# Patient Record
Sex: Male | Born: 1990 | Race: Black or African American | Hispanic: Yes | Marital: Single | Smoking: Current some day smoker
Health system: Southern US, Community
[De-identification: ages and names within clinical notes are randomized; demographics above are authoritative.]

## PROBLEM LIST (undated history)

## (undated) DIAGNOSIS — F32A Depression, unspecified: Secondary | ICD-10-CM

## (undated) DIAGNOSIS — F319 Bipolar disorder, unspecified: Secondary | ICD-10-CM

## (undated) DIAGNOSIS — J45909 Unspecified asthma, uncomplicated: Secondary | ICD-10-CM

## (undated) DIAGNOSIS — F329 Major depressive disorder, single episode, unspecified: Secondary | ICD-10-CM

## (undated) DIAGNOSIS — F909 Attention-deficit hyperactivity disorder, unspecified type: Secondary | ICD-10-CM

---
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
## 2010-12-22 ENCOUNTER — Emergency Department (HOSPITAL_COMMUNITY)
Admission: EM | Admit: 2010-12-22 | Discharge: 2010-12-22 | Disposition: A | Discharge status: Home / Self Care | Payer: BC Managed Care – PPO | Attending: Emergency Medicine | Admitting: Emergency Medicine

## 2010-12-22 DIAGNOSIS — J45901 Unspecified asthma with (acute) exacerbation: Secondary | ICD-10-CM | POA: Insufficient documentation

## 2011-01-05 ENCOUNTER — Emergency Department (HOSPITAL_COMMUNITY): Payer: BC Managed Care – PPO

## 2011-01-05 ENCOUNTER — Emergency Department (HOSPITAL_COMMUNITY)
Admission: EM | Admit: 2011-01-05 | Discharge: 2011-01-05 | Disposition: A | Discharge status: Home / Self Care | Payer: BC Managed Care – PPO | Attending: Emergency Medicine | Admitting: Emergency Medicine

## 2011-01-05 DIAGNOSIS — R0989 Other specified symptoms and signs involving the circulatory and respiratory systems: Secondary | ICD-10-CM | POA: Insufficient documentation

## 2011-01-05 DIAGNOSIS — R05 Cough: Secondary | ICD-10-CM | POA: Insufficient documentation

## 2011-01-05 DIAGNOSIS — R059 Cough, unspecified: Secondary | ICD-10-CM | POA: Insufficient documentation

## 2011-01-05 DIAGNOSIS — R0609 Other forms of dyspnea: Secondary | ICD-10-CM | POA: Insufficient documentation

## 2011-01-05 DIAGNOSIS — J45901 Unspecified asthma with (acute) exacerbation: Secondary | ICD-10-CM | POA: Insufficient documentation

## 2011-01-05 DIAGNOSIS — R0602 Shortness of breath: Secondary | ICD-10-CM | POA: Insufficient documentation

## 2015-06-03 ENCOUNTER — Encounter (HOSPITAL_COMMUNITY): Payer: Self-pay

## 2015-06-03 ENCOUNTER — Emergency Department (HOSPITAL_COMMUNITY)
Admission: EM | Admit: 2015-06-03 | Discharge: 2015-06-03 | Disposition: A | Discharge status: Home / Self Care | Payer: BLUE CROSS/BLUE SHIELD | Attending: Emergency Medicine | Admitting: Emergency Medicine

## 2015-06-03 ENCOUNTER — Emergency Department (HOSPITAL_COMMUNITY): Payer: BLUE CROSS/BLUE SHIELD

## 2015-06-03 DIAGNOSIS — J45901 Unspecified asthma with (acute) exacerbation: Secondary | ICD-10-CM | POA: Diagnosis not present

## 2015-06-03 DIAGNOSIS — R059 Cough, unspecified: Secondary | ICD-10-CM

## 2015-06-03 DIAGNOSIS — R05 Cough: Secondary | ICD-10-CM | POA: Diagnosis present

## 2015-06-03 HISTORY — DX: Unspecified asthma, uncomplicated: J45.909

## 2015-06-03 MED ORDER — IPRATROPIUM-ALBUTEROL 0.5-2.5 (3) MG/3ML IN SOLN
3.0000 mL | Freq: Once | RESPIRATORY_TRACT | Status: AC
  Administered 2015-06-03: 3 mL via RESPIRATORY_TRACT
  Filled 2015-06-03: qty 3

## 2015-06-03 MED ORDER — PREDNISONE 20 MG PO TABS: 40.0000 mg | ORAL_TABLET | Freq: Every day | ORAL | Status: DC

## 2015-06-03 MED ORDER — DEXAMETHASONE SODIUM PHOSPHATE 10 MG/ML IJ SOLN
10.0000 mg | Freq: Once | INTRAMUSCULAR | Status: AC
  Administered 2015-06-03: 10 mg via INTRAMUSCULAR
  Filled 2015-06-03: qty 1

## 2015-06-03 MED ORDER — ALBUTEROL SULFATE HFA 108 (90 BASE) MCG/ACT IN AERS: 1.0000 | INHALATION_SPRAY | Freq: Four times a day (QID) | RESPIRATORY_TRACT | Status: DC | PRN

## 2015-06-03 MED ORDER — ALBUTEROL SULFATE (2.5 MG/3ML) 0.083% IN NEBU
INHALATION_SOLUTION | RESPIRATORY_TRACT | Status: AC
  Filled 2015-06-03: qty 6

## 2015-06-03 MED ORDER — ALBUTEROL SULFATE (2.5 MG/3ML) 0.083% IN NEBU
5.0000 mg | INHALATION_SOLUTION | Freq: Once | RESPIRATORY_TRACT | Status: AC
  Administered 2015-06-03: 5 mg via RESPIRATORY_TRACT

## 2015-06-03 MED ORDER — BENZONATATE 100 MG PO CAPS: 100.0000 mg | ORAL_CAPSULE | Freq: Three times a day (TID) | ORAL | Status: DC

## 2015-06-03 NOTE — ED Notes (Signed)
Pt reports onset yesterday cough, wheezing, fever.  Ran out of asthma inhaler.  Talking in complete sentences, NAD.

## 2015-06-03 NOTE — ED Provider Notes (Signed)
CSN: [id number]     Arrival date & time 06/03/15  1328 History  By signing my name below, I, Soijett Blue, attest that this documentation has been prepared under the direction and in the presence of Jovani Colquhoun Y. Lamon Rotundo, PA-C Electronically Signed: Soijett Blue, ED Scribe. 06/03/2015. 2:10 PM.   Chief Complaint  Patient presents with  . Cough      The history is provided by the patient. No language interpreter was used.    Patrick Clements is a 25 y.o. male with a PMHx of asthma, who presents to the Emergency Department complaining of dry cough onset yesterday. He reports that his symptoms began with a cold that started yesterday. Pt has sick contacts who have similar symptoms. He notes that he ran out of his inhaler that he uses every day and not only for sick episodes. Pt states that his symptoms have been mildly alleviated with the breathing treatment given in the ED waiting room. He states that he is having associated symptoms of wheezing, subjective fever, and SOB. He notes that coughing induces wheezing and SOB, which is similar to prior asthma attacks. He states that he has not tried any medications for the relief for his symptoms. He denies fever, chills, color change, rash, wound, rhinorrhea, nasal congestion, and any other symptoms. Denies having a PCP at this time. Pt notes that he quit smoking cigarettes this morning.    Past Medical History  Diagnosis Date  . Asthma    History reviewed. No pertinent past surgical history. History reviewed. No pertinent family history. Social History  Substance Use Topics  . Smoking status: Passive Smoke Exposure - Never Smoker  . Smokeless tobacco: None  . Alcohol Use: Yes    Review of Systems  Constitutional: Positive for fever (subjective). Negative for chills.  HENT: Negative for congestion and rhinorrhea.   Respiratory: Positive for cough, shortness of breath and wheezing.   Skin: Negative for color change, rash and wound.  All other systems  reviewed and are negative.     Allergies  Review of patient's allergies indicates no known allergies.  Home Medications   Prior to Admission medications   Not on File   BP 129/69 mmHg  Pulse 109  Temp(Src) 98.1 F (36.7 C) (Oral)  Resp 20  Ht  (1.676 m)  Wt 257 lb 8 oz (116.801 kg)  BMI 41.58 kg/m2  SpO2 97% Physical Exam  Constitutional: He is oriented to person, place, and time. He appears well-developed and well-nourished. No distress.  HENT:  Head: Normocephalic and atraumatic.  Eyes: EOM are normal.  Neck: Neck supple.  Cardiovascular: Normal rate, regular rhythm and normal heart sounds.  Exam reveals no gallop and no friction rub.   No murmur heard. Pulmonary/Chest: Effort normal. No respiratory distress. He has wheezes. He has rhonchi.  Diffuse expiratory wheezes in all lung fields. Bilateral lung bases have soft rhonchi that clear with coughing.   Musculoskeletal: Normal range of motion.  Neurological: He is alert and oriented to person, place, and time.  Skin: Skin is warm and dry.  Psychiatric: He has a normal mood and affect. His behavior is normal.  Nursing note and vitals reviewed.   ED Course  Procedures (including critical care time) DIAGNOSTIC STUDIES: Oxygen Saturation is 97% on RA, nl by my interpretation.    COORDINATION OF CARE: 2:07 PM Discussed treatment plan with pt at bedside which includes CXR, breathing treatment, steroid Rx, and pt agreed to plan.    Labs  Review Labs Reviewed - No data to display  Imaging Review Dg Chest 2 View  06/03/2015  CLINICAL DATA:  Cough and shortness of breath EXAM: CHEST  2 VIEW COMPARISON:  January 05, 2011 FINDINGS: The heart size and mediastinal contours are within normal limits. Both lungs are clear. The visualized skeletal structures are unremarkable. IMPRESSION: No active cardiopulmonary disease. Electronically Signed   By: Gerome Samavid  Williams III M.D   On: 06/03/2015 15:10   I have personally  reviewed and evaluated these images as part of my medical decision-making.   EKG Interpretation None      MDM   Final diagnoses:  Cough  Asthma exacerbation    CXR negative. Pt given duoneb and decadron in the ED with much improvement of wheezing. Pt is breathing comfortably with no tachypnea or hypoxia. No increased WOB. Suspect viral URI with concomittant URI exacerbation. Refill for albuterol given. Rx also given for few days of steroids and tessalon. I discussed with pt that he should talk to PCP about PFTs and consider daily inhaled corticosteroid given frequency of albuterol use. Encouraged continuing with smoking cessation. Resource guide given. ER return precautions given.   I personally performed the services described in this documentation, which was scribed in my presence. The recorded information has been reviewed and is accurate.   Carlene CoriaSerena Y Ariel Dimitri, PA-C 06/04/15 1426  Donnetta HutchingBrian Cook, MD 06/04/15 [phone]

## 2015-06-03 NOTE — Discharge Instructions (Signed)
Take medications as prescribed. Return to the emergency room for worsening condition or new concerning symptoms. Follow up with your regular doctor. If you don't have a regular doctor use one of the numbers below to establish a primary care doctor. ° ° °Emergency Department Resource Guide °1) Find a Doctor and Pay Out of Pocket °Although you won't have to find out who is covered by your insurance plan, it is a good idea to ask around and get recommendations. You will then need to call the office and see if the doctor you have chosen will accept you as a new patient and what types of options they offer for patients who are self-pay. Some doctors offer discounts or will set up payment plans for their patients who do not have insurance, but you will need to ask so you aren't surprised when you get to your appointment. ° °2) Contact Your Local Health Department °Not all health departments have doctors that can see patients for sick visits, but many do, so it is worth a call to see if yours does. If you don't know where your local health department is, you can check in your phone book. The CDC also has a tool to help you locate your state's health department, and many state websites also have listings of all of their local health departments. ° °3) Find a Walk-in Clinic °If your illness is not likely to be very severe or complicated, you may want to try a walk in clinic. These are popping up all over the country in pharmacies, drugstores, and shopping centers. They're usually staffed by nurse practitioners or physician assistants that have been trained to treat common illnesses and complaints. They're usually fairly quick and inexpensive. However, if you have serious medical issues or chronic medical problems, these are probably not your best option. ° °No Primary Care Doctor: °- Call [facility] at  832-8000 - they can help you locate a primary care doctor that  accepts your insurance, provides certain services,  etc. °- Physician Referral Service- 1-800-533-3463 ° °Emergency Department Resource Guide °1) Find a Doctor and Pay Out of Pocket °Although you won't have to find out who is covered by your insurance plan, it is a good idea to ask around and get recommendations. You will then need to call the office and see if the doctor you have chosen will accept you as a new patient and what types of options they offer for patients who are self-pay. Some doctors offer discounts or will set up payment plans for their patients who do not have insurance, but you will need to ask so you aren't surprised when you get to your appointment. ° °2) Contact Your Local Health Department °Not all health departments have doctors that can see patients for sick visits, but many do, so it is worth a call to see if yours does. If you don't know where your local health department is, you can check in your phone book. The CDC also has a tool to help you locate your state's health department, and many state websites also have listings of all of their local health departments. ° °3) Find a Walk-in Clinic °If your illness is not likely to be very severe or complicated, you may want to try a walk in clinic. These are popping up all over the country in pharmacies, drugstores, and shopping centers. They're usually staffed by nurse practitioners or physician assistants that have been trained to treat common illnesses and complaints. They're usually fairly   quick and inexpensive. However, if you have serious medical issues or chronic medical problems, these are probably not your best option. ° °No Primary Care Doctor: °- Call [facility] at  832-8000 - they can help you locate a primary care doctor that  accepts your insurance, provides certain services, etc. °- Physician Referral Service- 1-800-533-3463 ° °Chronic Pain Problems: °Organization         Address  Phone   Notes  °[facility]  ([phone]) [phone] Patients need to be referred by  their primary care doctor.  ° °Medication Assistance: °Organization         Address  Phone   Notes  °[place] [facility] [address] °[place], [place] [address] ([phone]) 641-8030 --Must be a resident of [place] °-- Must have NO insurance coverage whatsoever (no Medicaid/ Medicare, etc.) °-- The pt. MUST have a primary care doctor that directs their care regularly and follows them in the community °  °[facility]  (866) 331-1348   °[employer]  (888) 892-1162   ° °Agencies that provide inexpensive medical care: °Organization         Address  Phone   Notes  °[facility]  ([phone]) [phone]   °[facility]    (336) 832-7272   °[facility] [address] °[place], [place] [address] (336) [phone]   °[facility] [address], °[place] (336) [phone]   °[facility]    (336) 373-0678   °[facility]    (336) 272-1050   °[facility] ° [address], [place] Phone:  (336) [phone], Fax:  ([phone]) [phone] Hours of Operation:  9 am - 6 pm, M-F.  Also accepts Medicaid/Medicare and self-pay.  °[facility] ° [address], [place] Phone: (336) [phone], Fax: ([phone]) [phone]. Hours of Operation:  8:30 am - 5:30 pm, M-F.  Also accepts Medicaid and self-pay.  °[facility] [place] [address], [place] Phone: ([phone]) [phone]   °[facility] [address], [place], [place] (336)723-1848, Ext. 123 Mondays & Thursdays: 7-9 AM.  First 15 patients are seen on a first come, first serve basis. °  ° °Medicaid-accepting [place] Providers: ° °Organization         Address  Phone   Notes  °[facility] [address], [place] ([phone]) [phone] Also accepts self-pay patients.  °[facility] [address], [place] ° (336) [phone]   °[facility]  [address], [place] (336) [phone]   °[facility] [address] [place] [address], [place] (336) [phone]   °[facility] [address], [place]  ° ([phone]) [phone] Only accepts [place] Access Medicaid patients after they have their name applied to their card.  ° °Self-Pay (no insurance) in [place]: ° °Organization         Address  Phone   Notes  °Sickle Cell Patients, [facility] [address], [place] (336) [phone]   °[facility] [address], [place] (336) [phone]   °[facility] ° [address], [place] ([phone]) [phone]   °[facility]/Dr. Osei-Bonsu ° [address] [place] [address],  or [address], [place] [phone]) [phone] Phone number   for both [place] and [place] locations is the same.  °[facility] [address], [place] ([phone]) [phone]   °[facility] [address] [place] [address], [place] or [address] ([phone]) [phone] °(336) [phone]   °[facility] [address], [place] (336) [phone], phone; ([phone]) [phone], fax Sees patients 1st and 3rd Saturday of every month.  Must not qualify for public or private insurance (i.e. Medicaid, Medicare, [place] Health Choice, Veterans' Benefits) • Household income should be no more than 200% of the poverty level •The clinic cannot treat you if you are pregnant or think you are pregnant • Sexually transmitted diseases are not treated at the clinic.  ° ° ° °

## 2015-06-03 NOTE — ED Notes (Signed)
PT states " I use a mask for my treatments."

## 2015-09-01 DIAGNOSIS — J40 Bronchitis, not specified as acute or chronic: Secondary | ICD-10-CM | POA: Insufficient documentation

## 2015-09-01 DIAGNOSIS — Z7722 Contact with and (suspected) exposure to environmental tobacco smoke (acute) (chronic): Secondary | ICD-10-CM | POA: Insufficient documentation

## 2015-09-01 DIAGNOSIS — R05 Cough: Secondary | ICD-10-CM | POA: Diagnosis present

## 2015-09-02 ENCOUNTER — Emergency Department (HOSPITAL_COMMUNITY): Payer: BLUE CROSS/BLUE SHIELD

## 2015-09-02 ENCOUNTER — Emergency Department (HOSPITAL_COMMUNITY)
Admission: EM | Admit: 2015-09-02 | Discharge: 2015-09-02 | Disposition: A | Discharge status: Home / Self Care | Payer: BLUE CROSS/BLUE SHIELD | Attending: Emergency Medicine | Admitting: Emergency Medicine

## 2015-09-02 ENCOUNTER — Encounter (HOSPITAL_COMMUNITY): Payer: Self-pay | Admitting: Emergency Medicine

## 2015-09-02 DIAGNOSIS — J40 Bronchitis, not specified as acute or chronic: Secondary | ICD-10-CM

## 2015-09-02 MED ORDER — ALBUTEROL SULFATE HFA 108 (90 BASE) MCG/ACT IN AERS
1.0000 | INHALATION_SPRAY | Freq: Once | RESPIRATORY_TRACT | Status: AC
  Administered 2015-09-02: 1 via RESPIRATORY_TRACT
  Filled 2015-09-02: qty 6.7

## 2015-09-02 MED ORDER — ALBUTEROL SULFATE HFA 108 (90 BASE) MCG/ACT IN AERS: 1.0000 | INHALATION_SPRAY | Freq: Four times a day (QID) | RESPIRATORY_TRACT | Status: DC | PRN

## 2015-09-02 MED ORDER — ALBUTEROL SULFATE (2.5 MG/3ML) 0.083% IN NEBU
INHALATION_SOLUTION | RESPIRATORY_TRACT | Status: AC
  Filled 2015-09-02: qty 6

## 2015-09-02 MED ORDER — PREDNISONE 20 MG PO TABS: 40.0000 mg | ORAL_TABLET | Freq: Every day | ORAL | Status: DC

## 2015-09-02 MED ORDER — ALBUTEROL SULFATE (2.5 MG/3ML) 0.083% IN NEBU
5.0000 mg | INHALATION_SOLUTION | Freq: Once | RESPIRATORY_TRACT | Status: AC
  Administered 2015-09-02: 5 mg via RESPIRATORY_TRACT

## 2015-09-02 NOTE — ED Provider Notes (Signed)
CSN: [id number]     Arrival date & time 09/01/15  2335 History  By signing my name below, I, Phillis HaggisGabriella Gaje, attest that this documentation has been prepared under the direction and in the presence of Derwood KaplanAnkit Maylynn Orzechowski, MD. Electronically Signed: Phillis HaggisGabriella Gaje, ED Scribe. 09/02/2015. 3:47 AM.    Chief Complaint  Patient presents with  . Asthma  . Cough   The history is provided by the patient. No language interpreter was used.  HPI Comments: Patrick Clements is a 25 y.o. male with a hx of asthma who presents to the Emergency Department complaining of persistent dry cough onset 2 days ago. Pt reports associated wheezing and SOB. He reports that this feels like an exacerbation of his asthma which started after running around with his child. Pt did not use any inhalers or breathing treatments at home. Pt states that he does not have any medications at home. He states that he typically comes into the ED about once a month for asthma related symptoms. He reports mild relief with breathing treatment given in ED. He denies fever or chest pain. Pt is an occasional smoker. He does not have a PCP.  Past Medical History  Diagnosis Date  . Asthma    History reviewed. No pertinent past surgical history. History reviewed. No pertinent family history. Social History  Substance Use Topics  . Smoking status: Passive Smoke Exposure - Never Smoker  . Smokeless tobacco: None  . Alcohol Use: Yes    Review of Systems 10 Systems reviewed and all are negative for acute change except as noted in the HPI.  Allergies  Review of patient's allergies indicates no known allergies.  Home Medications   Prior to Admission medications   Medication Sig Start Date End Date Taking? Authorizing Provider  albuterol (PROVENTIL HFA;VENTOLIN HFA) 108 (90 Base) MCG/ACT inhaler Inhale 1-2 puffs into the lungs every 6 (six) hours as needed for wheezing or shortness of breath. 09/02/15   Derwood KaplanAnkit Vivian Neuwirth, MD  benzonatate (TESSALON)  100 MG capsule Take 1 capsule (100 mg total) by mouth every 8 (eight) hours. Patient not taking: Reported on 09/02/2015 06/03/15   Ace GinsSerena Y Sam, PA-C  predniSONE (DELTASONE) 20 MG tablet Take 2 tablets (40 mg total) by mouth daily. 09/02/15   Lotta Frankenfield, MD   BP 122/78 mmHg  Pulse 74  Temp(Src) 98.3 F (36.8 C) (Oral)  Resp 16  SpO2 99% Physical Exam  Constitutional: He is oriented to person, place, and time. He appears well-developed and well-nourished.  HENT:  Head: Normocephalic and atraumatic.  Eyes: EOM are normal. Pupils are equal, round, and reactive to light.  Neck: Normal range of motion. Neck supple.  Cardiovascular: Normal rate, regular rhythm and normal heart sounds.   Pulmonary/Chest: Effort normal and breath sounds normal. He has no wheezes.  Lungs clear to auscultation  Abdominal: Soft. There is no tenderness.  Musculoskeletal: Normal range of motion.  Neurological: He is alert and oriented to person, place, and time.  Skin: Skin is warm and dry.  Psychiatric: He has a normal mood and affect. His behavior is normal.  Nursing note and vitals reviewed.   ED Course  Procedures (including critical care time) DIAGNOSTIC STUDIES: Oxygen Saturation is 98% on RA, normal by my interpretation.    COORDINATION OF CARE: 3:44 AM-Discussed treatment plan which includes antibiotics and chest x-ray with pt at bedside and pt agreed to plan.    Labs Review Labs Reviewed - No data to display  Imaging Review Dg  Chest 2 View  09/02/2015  CLINICAL DATA:  Cough for 2 days.  Shortness of breath.  Nonsmoker. EXAM: CHEST  2 VIEW COMPARISON:  06/03/2015 FINDINGS: Normal heart size and pulmonary vascularity. Suggestion of developing hazy opacity in the right perihilar region possibly representing developing pneumonia. Left lung is clear. No blunting of costophrenic angles. No pneumothorax. IMPRESSION: Suggestion of developing right perihilar infiltration. Electronically Signed   By:  Burman Nieves M.D.   On: 09/02/2015 01:50   I have personally reviewed and evaluated these images and lab results as part of my medical decision-making.   EKG Interpretation None      MDM   Final diagnoses:  Bronchitis    Pt comes in with cc of wheezing. Cough x 2 days, dry, lungs are clear now, but pt was wheezing when he arrived. Nebulizer handed over, and insurance and steroids rx given.    Derwood Kaplan, MD 09/02/15 [phone]

## 2015-09-02 NOTE — ED Notes (Signed)
Patient arrives with complaint of asthma exacerbation. States onset today after running with child. States nagging cough for 2 days. Wheezing and diminished in all fields.

## 2015-09-02 NOTE — Discharge Instructions (Signed)
Asthma Attack Prevention While you may not be able to control the fact that you have asthma, you can take actions to prevent asthma attacks. The best way to prevent asthma attacks is to maintain good control of your asthma. You can achieve this by:  Taking your medicines as directed.  Avoiding things that can irritate your airways or make your asthma symptoms worse (asthma triggers).  Keeping track of how well your asthma is controlled and of any changes in your symptoms.  Responding quickly to worsening asthma symptoms (asthma attack).  Seeking emergency care when it is needed. WHAT ARE SOME WAYS TO PREVENT AN ASTHMA ATTACK? Have a Plan Work with your health care provider to create a written plan for managing and treating your asthma attacks (asthma action plan). This plan includes:  A list of your asthma triggers and how you can avoid them.  Information on when medicines should be taken and when their dosages should be changed.  The use of a device that measures how well your lungs are working (peak flow meter). Monitor Your Asthma Use your peak flow meter and record your results in a journal every day. A drop in your peak flow numbers on one or more days may indicate the start of an asthma attack. This can happen even before you start to feel symptoms. You can prevent an asthma attack from getting worse by following the steps in your asthma action plan. Avoid Asthma Triggers Work with your asthma health care provider to find out what your asthma triggers are. This can be done by:  Allergy testing.  Keeping a journal that notes when asthma attacks occur and the factors that may have contributed to them.  Determining if there are other medical conditions that are making your asthma worse. Once you have determined your asthma triggers, take steps to avoid them. This may include avoiding excessive or prolonged exposure to:  Dust. Have someone dust and vacuum your home for you once or  twice a week. Using a high-efficiency particulate arrestance (HEPA) vacuum is best.  Smoke. This includes campfire smoke, forest fire smoke, and secondhand smoke from tobacco products.  Pet dander. Avoid contact with animals that you know you are allergic to.  Allergens from trees, grasses or pollens. Avoid spending a lot of time outdoors when pollen counts are high, and on very windy days.  Very cold, dry, or humid air.  Mold.  Foods that contain high amounts of sulfites.  Strong odors.  Outdoor air pollutants, such as engine exhaust.  Indoor air pollutants, such as aerosol sprays and fumes from household cleaners.  Household pests, including dust mites and cockroaches, and pest droppings.  Certain medicines, including NSAIDs. Always talk to your health care provider before stopping or starting any new medicines. Medicines Take over-the-counter and prescription medicines only as told by your health care provider. Many asthma attacks can be prevented by carefully following your medicine schedule. Taking your medicines correctly is especially important when you cannot avoid certain asthma triggers. Act Quickly If an asthma attack does happen, acting quickly can decrease how severe it is and how long it lasts. Take these steps:   Pay attention to your symptoms. If you are coughing, wheezing, or having difficulty breathing, do not wait to see if your symptoms go away on their own. Follow your asthma action plan.  If you have followed your asthma action plan and your symptoms are not improving, call your health care provider or seek immediate medical care   at the nearest hospital. It is important to note how often you need to use your fast-acting rescue inhaler. If you are using your rescue inhaler more often, it may mean that your asthma is not under control. Adjusting your asthma treatment plan may help you to prevent future asthma attacks and help you to gain better control of your  condition. HOW CAN I PREVENT AN ASTHMA ATTACK WHEN I EXERCISE? Follow advice from your health care provider about whether you should use your fast-acting inhaler before exercising. Many people with asthma experience exercise-induced bronchoconstriction (EIB). This condition often worsens during vigorous exercise in cold, humid, or dry environments. Usually, people with EIB can stay very active by pre-treating with a fast-acting inhaler before exercising.   This information is not intended to replace advice given to you by your health care provider. Make sure you discuss any questions you have with your health care provider.   Document Released: 02/20/2009 Document Revised: 11/23/2014 Document Reviewed: 08/04/2014 Elsevier Interactive Patient Education 2016 [employer]  

## 2015-09-13 ENCOUNTER — Encounter (HOSPITAL_COMMUNITY): Payer: Self-pay | Admitting: Emergency Medicine

## 2015-09-13 ENCOUNTER — Ambulatory Visit (HOSPITAL_COMMUNITY)
Admission: EM | Admit: 2015-09-13 | Discharge: 2015-09-13 | Disposition: A | Discharge status: Home / Self Care | Payer: BLUE CROSS/BLUE SHIELD | Attending: Family Medicine | Admitting: Family Medicine

## 2015-09-13 DIAGNOSIS — J45901 Unspecified asthma with (acute) exacerbation: Secondary | ICD-10-CM

## 2015-09-13 MED ORDER — PREDNISONE 10 MG (21) PO TBPK: 10.0000 mg | ORAL_TABLET | Freq: Every day | ORAL | Status: DC

## 2015-09-13 MED ORDER — ALBUTEROL SULFATE HFA 108 (90 BASE) MCG/ACT IN AERS: 2.0000 | INHALATION_SPRAY | RESPIRATORY_TRACT | Status: DC | PRN

## 2015-09-13 MED ORDER — ALBUTEROL SULFATE (2.5 MG/3ML) 0.083% IN NEBU: 2.5000 mg | INHALATION_SOLUTION | Freq: Once | RESPIRATORY_TRACT | Status: DC

## 2015-09-13 MED ORDER — ALBUTEROL SULFATE (2.5 MG/3ML) 0.083% IN NEBU
INHALATION_SOLUTION | RESPIRATORY_TRACT | Status: AC
  Filled 2015-09-13: qty 3

## 2015-09-13 NOTE — Discharge Instructions (Signed)
Asthma, Adult Asthma is a condition of the lungs in which the airways tighten and narrow. Asthma can make it hard to breathe. Asthma cannot be cured, but medicine and lifestyle changes can help control it. Asthma may be started (triggered) by:  Animal skin flakes (dander).  Dust.  Cockroaches.  Pollen.  Mold.  Smoke.  Cleaning products.  Hair sprays or aerosol sprays.  Paint fumes or strong smells.  Cold air, weather changes, and winds.  Crying or laughing hard.  Stress.  Certain medicines or drugs.  Foods, such as dried fruit, potato chips, and sparkling grape juice.  Infections or conditions (colds, flu).  Exercise.  Certain medical conditions or diseases.  Exercise or tiring activities. HOME CARE   Take medicine as told by your doctor.  Use a peak flow meter as told by your doctor. A peak flow meter is a tool that measures how well the lungs are working.  Record and keep track of the peak flow meter's readings.  Understand and use the asthma action plan. An asthma action plan is a written plan for taking care of your asthma and treating your attacks.  To help prevent asthma attacks:  Do not smoke. Stay away from secondhand smoke.  Change your heating and air conditioning filter often.  Limit your use of fireplaces and wood stoves.  Get rid of pests (such as roaches and mice) and their droppings.  Throw away plants if you see mold on them.  Clean your floors. Dust regularly. Use cleaning products that do not smell.  Have someone vacuum when you are not home. Use a vacuum cleaner with a HEPA filter if possible.  Replace carpet with wood, tile, or vinyl flooring. Carpet can trap animal skin flakes and dust.  Use allergy-proof pillows, mattress covers, and box spring covers.  Wash bed sheets and blankets every week in hot water and dry them in a dryer.  Use blankets that are made of polyester or cotton.  Clean bathrooms and kitchens with bleach.  If possible, have someone repaint the walls in these rooms with mold-resistant paint. Keep out of the rooms that are being cleaned and painted.  Wash hands often. GET HELP IF:  You have make a whistling sound when breaking (wheeze), have shortness of breath, or have a cough even if taking medicine to prevent attacks.  The colored mucus you cough up (sputum) is thicker than usual.  The colored mucus you cough up changes from clear or white to yellow, green, gray, or bloody.  You have problems from the medicine you are taking such as:  A rash.  Itching.  Swelling.  Trouble breathing.  You need reliever medicines more than 2-3 times a week.  Your peak flow measurement is still at 50-79% of your personal best after following the action plan for 1 hour.  You have a fever. GET HELP RIGHT AWAY IF:   You seem to be worse and are not responding to medicine during an asthma attack.  You are short of breath even at rest.  You get short of breath when doing very little activity.  You have trouble eating, drinking, or talking.  You have chest pain.  You have a fast heartbeat.  Your lips or fingernails start to turn blue.  You are light-headed, dizzy, or faint.  Your peak flow is less than 50% of your personal best.   This information is not intended to replace advice given to you by your health care provider. Make sure   you discuss any questions you have with your health care provider.   Document Released: 08/21/2007 Document Revised: 11/23/2014 Document Reviewed: 10/01/2012 Elsevier Interactive Patient Education 2016 Elsevier Inc.  

## 2015-09-13 NOTE — ED Provider Notes (Signed)
CSN: [id number]     Arrival date & time 09/13/15  1940 History   First MD Initiated Contact with Patient 09/13/15 1950     Chief Complaint  Patient presents with  . Shortness of Breath   (Consider location/radiation/quality/duration/timing/severity/associated sxs/prior Treatment) Patient is a 25 y.o. male presenting with shortness of breath. The history is provided by the patient.  Shortness of Breath Severity:  Moderate Onset quality:  Sudden Duration:  2 days Timing:  Constant Progression:  Waxing and waning Chronicity:  Recurrent Context: activity   Relieved by:  Inhaler Ineffective treatments:  Inhaler   Past Medical History  Diagnosis Date  . Asthma    History reviewed. No pertinent past surgical history. History reviewed. No pertinent family history. Social History  Substance Use Topics  . Smoking status: Passive Smoke Exposure - Never Smoker  . Smokeless tobacco: None  . Alcohol Use: Yes    Review of Systems  Constitutional: Negative.   HENT: Negative.   Eyes: Negative.   Respiratory: Positive for shortness of breath.   Cardiovascular: Negative.   Gastrointestinal: Negative.   Endocrine: Negative.   Genitourinary: Negative.   Musculoskeletal: Negative.   Skin: Negative.   Allergic/Immunologic: Negative.   Neurological: Negative.   Hematological: Negative.   Psychiatric/Behavioral: Negative.     Allergies  Review of patient's allergies indicates no known allergies.  Home Medications   Prior to Admission medications   Medication Sig Start Date End Date Taking? Authorizing Provider  albuterol (PROVENTIL HFA;VENTOLIN HFA) 108 (90 Base) MCG/ACT inhaler Inhale 1-2 puffs into the lungs every 6 (six) hours as needed for wheezing or shortness of breath. 09/02/15  Yes Derwood KaplanAnkit Nanavati, MD  albuterol (PROVENTIL HFA;VENTOLIN HFA) 108 (90 Base) MCG/ACT inhaler Inhale 2 puffs into the lungs every 4 (four) hours as needed for wheezing or shortness of breath. 09/13/15    Deatra CanterWilliam J Oxford, FNP  benzonatate (TESSALON) 100 MG capsule Take 1 capsule (100 mg total) by mouth every 8 (eight) hours. Patient not taking: Reported on 09/02/2015 06/03/15   Ace GinsSerena Y Sam, PA-C  predniSONE (DELTASONE) 20 MG tablet Take 2 tablets (40 mg total) by mouth daily. 09/02/15   Derwood KaplanAnkit Nanavati, MD  predniSONE (STERAPRED UNI-PAK 21 TAB) 10 MG (21) TBPK tablet Take 1 tablet (10 mg total) by mouth daily. Take 6-5-4-3-2-1 po qd 09/13/15   Deatra CanterWilliam J Oxford, FNP   Meds Ordered and Administered this Visit   Medications  albuterol (PROVENTIL) (2.5 MG/3ML) 0.083% nebulizer solution 2.5 mg (not administered)    BP 125/78 mmHg  Pulse 85  Temp(Src) 97.8 F (36.6 C) (Oral)  Resp 18  SpO2 97% No data found.   Physical Exam  Constitutional: He appears well-developed and well-nourished.  HENT:  Head: Normocephalic.  Right Ear: External ear normal.  Left Ear: External ear normal.  Mouth/Throat: Oropharynx is clear and moist.  Eyes: Conjunctivae and EOM are normal. Pupils are equal, round, and reactive to light.  Neck: Normal range of motion. Neck supple.  Cardiovascular: Normal rate, regular rhythm and normal heart sounds.   Pulmonary/Chest: Effort normal.  BBS diminished throughout  Abdominal: Soft.    ED Course  Procedures (including critical care time)  Labs Review Labs Reviewed - No data to display  Imaging Review No results found.   Visual Acuity Review  Right Eye Distance:   Left Eye Distance:   Bilateral Distance:    Right Eye Near:   Left Eye Near:    Bilateral Near:  MDM  1. Asthma exacerbation - Neb tx albuterol 2.5/873ml, Prednisone taper 10mg  6-5-4-3-2-1 po qd #21    Albuterol MDI 2 puffs q 4 hours prn.  Explained need to follow up with PCP.      Deatra CanterWilliam J Oxford, FNP 09/13/15 2020

## 2015-09-13 NOTE — ED Notes (Signed)
[facility] a complaint of shortness of breath and wheezing x 2 days. The patient does have a hx of asthma. The patient stated that he needed a note to return to work.

## 2015-09-15 ENCOUNTER — Ambulatory Visit (HOSPITAL_COMMUNITY)
Admission: EM | Admit: 2015-09-15 | Discharge: 2015-09-15 | Disposition: A | Discharge status: Home / Self Care | Payer: BLUE CROSS/BLUE SHIELD | Attending: Emergency Medicine | Admitting: Emergency Medicine

## 2015-09-15 ENCOUNTER — Encounter (HOSPITAL_COMMUNITY): Payer: Self-pay | Admitting: Nurse Practitioner

## 2015-09-15 DIAGNOSIS — Z9119 Patient's noncompliance with other medical treatment and regimen: Secondary | ICD-10-CM

## 2015-09-15 DIAGNOSIS — J4541 Moderate persistent asthma with (acute) exacerbation: Secondary | ICD-10-CM | POA: Diagnosis not present

## 2015-09-15 DIAGNOSIS — Z91199 Patient's noncompliance with other medical treatment and regimen due to unspecified reason: Secondary | ICD-10-CM

## 2015-09-15 MED ORDER — ALBUTEROL SULFATE (2.5 MG/3ML) 0.083% IN NEBU
2.5000 mg | INHALATION_SOLUTION | Freq: Once | RESPIRATORY_TRACT | Status: AC
  Administered 2015-09-15: 2.5 mg via RESPIRATORY_TRACT

## 2015-09-15 MED ORDER — METHYLPREDNISOLONE ACETATE 80 MG/ML IJ SUSP
80.0000 mg | Freq: Once | INTRAMUSCULAR | Status: AC
  Administered 2015-09-15: 80 mg via INTRAMUSCULAR

## 2015-09-15 MED ORDER — METHYLPREDNISOLONE ACETATE 80 MG/ML IJ SUSP
INTRAMUSCULAR | Status: AC
  Filled 2015-09-15: qty 1

## 2015-09-15 MED ORDER — IPRATROPIUM-ALBUTEROL 0.5-2.5 (3) MG/3ML IN SOLN
3.0000 mL | Freq: Once | RESPIRATORY_TRACT | Status: AC
  Administered 2015-09-15: 3 mL via RESPIRATORY_TRACT

## 2015-09-15 MED ORDER — ALBUTEROL SULFATE (2.5 MG/3ML) 0.083% IN NEBU
INHALATION_SOLUTION | RESPIRATORY_TRACT | Status: AC
  Filled 2015-09-15: qty 3

## 2015-09-15 MED ORDER — IPRATROPIUM-ALBUTEROL 0.5-2.5 (3) MG/3ML IN SOLN
RESPIRATORY_TRACT | Status: AC
  Filled 2015-09-15: qty 3

## 2015-09-15 NOTE — ED Provider Notes (Signed)
CSN: [id number]     Arrival date & time 09/15/15  1932 History   First MD Initiated Contact with Patient 09/15/15 1946     Chief Complaint  Patient presents with  . Asthma   (Consider location/radiation/quality/duration/timing/severity/associated sxs/prior Treatment) HPI Comments: 25 year old male with a history of asthma complaining of anterior chest tightness and shortness of breath. He states he has been having persistent asthmatic problems for over a week. He has been to the emergent department once last week and here 2 days ago for management consisting of repeated nebulizers. He states he does not have enough money to get his prednisone filled. He has used his albuterol 4 times today. He does not have a PCP.   Past Medical History  Diagnosis Date  . Asthma    History reviewed. No pertinent past surgical history. History reviewed. No pertinent family history. Social History  Substance Use Topics  . Smoking status: Passive Smoke Exposure - Never Smoker  . Smokeless tobacco: None  . Alcohol Use: Yes    Review of Systems  Constitutional: Positive for activity change. Negative for fever.  HENT: Negative.   Respiratory: Positive for chest tightness, shortness of breath and wheezing.   Cardiovascular: Negative for chest pain and leg swelling.  Gastrointestinal: Negative.   Skin: Negative for rash.  Neurological: Negative.   All other systems reviewed and are negative.   Allergies  Review of patient's allergies indicates no known allergies.  Home Medications   Prior to Admission medications   Medication Sig Start Date End Date Taking? Authorizing Provider  albuterol (PROVENTIL HFA;VENTOLIN HFA) 108 (90 Base) MCG/ACT inhaler Inhale 1-2 puffs into the lungs every 6 (six) hours as needed for wheezing or shortness of breath. 09/02/15   Derwood KaplanAnkit Nanavati, MD  albuterol (PROVENTIL HFA;VENTOLIN HFA) 108 (90 Base) MCG/ACT inhaler Inhale 2 puffs into the lungs every 4 (four) hours as  needed for wheezing or shortness of breath. 09/13/15   Deatra CanterWilliam J Oxford, FNP  benzonatate (TESSALON) 100 MG capsule Take 1 capsule (100 mg total) by mouth every 8 (eight) hours. Patient not taking: Reported on 09/02/2015 06/03/15   Ace GinsSerena Y Sam, PA-C  predniSONE (DELTASONE) 20 MG tablet Take 2 tablets (40 mg total) by mouth daily. 09/02/15   Derwood KaplanAnkit Nanavati, MD  predniSONE (STERAPRED UNI-PAK 21 TAB) 10 MG (21) TBPK tablet Take 1 tablet (10 mg total) by mouth daily. Take 6-5-4-3-2-1 po qd 09/13/15   Deatra CanterWilliam J Oxford, FNP   Meds Ordered and Administered this Visit   Medications  ipratropium-albuterol (DUONEB) 0.5-2.5 (3) MG/3ML nebulizer solution 3 mL (3 mLs Nebulization Given 09/15/15 2019)  albuterol (PROVENTIL) (2.5 MG/3ML) 0.083% nebulizer solution 2.5 mg (2.5 mg Nebulization Given 09/15/15 2019)  methylPREDNISolone acetate (DEPO-MEDROL) injection 80 mg (80 mg Intramuscular Given 09/15/15 2018)    BP 125/80 mmHg  Pulse 73  Temp(Src) 97.4 F (36.3 C) (Oral)  Resp 12  SpO2 100% No data found.   Physical Exam  Constitutional: He is oriented to person, place, and time. He appears well-developed and well-nourished. No distress.  Upon entering the room the patient is reclined in a supine position on the exam table. He appears to be in no respiratory distress. His eyes are close and occasionally has snoring.  Eyes: EOM are normal.  Neck: Normal range of motion. Neck supple.  Cardiovascular: Normal rate.   Pulmonary/Chest: Effort normal. No respiratory distress. He has wheezes. He has no rales.  Inspiratory and expiratory wheezes bilaterally. Prolonged expiratory phase.  Musculoskeletal: He  exhibits no edema.  Neurological: He is alert and oriented to person, place, and time. He exhibits normal muscle tone.  Skin: Skin is warm and dry.  Psychiatric: He has a normal mood and affect.  Nursing note and vitals reviewed.   ED Course  Procedures (including critical care time)  Labs Review Labs  Reviewed - No data to display  Imaging Review No results found.   Visual Acuity Review  Right Eye Distance:   Left Eye Distance:   Bilateral Distance:    Right Eye Near:   Left Eye Near:    Bilateral Near:         MDM   1. Asthma, moderate persistent, with acute exacerbation   2. Personal history of noncompliance with medical treatment, presenting hazards to health     Meds ordered this encounter  Medications  . ipratropium-albuterol (DUONEB) 0.5-2.5 (3) MG/3ML nebulizer solution 3 mL    Sig:   . albuterol (PROVENTIL) (2.5 MG/3ML) 0.083% nebulizer solution 2.5 mg    Sig:   . methylPREDNISolone acetate (DEPO-MEDROL) injection 80 mg    Sig:   You need to get your prescription for prednisone filled and soon as possible. It should be relatively inexpensive. Use your inhaler 2 puffs every 4 hours as needed for wheezing. Obtain a primary care doctor as soon as possible. Call one of the telephone numbers listed in your instruction sheets to find a doctor to help manage your asthma. Read your instructions carefully.  Post nebulizer as documented above patient states his breathing much better and feeling better. Air movement is essentially improved. Diminished wheezing. No inspiratory wheezes and expiratory wheezes are decreased.       Hayden Rasmussenavid Jamiaya Bina, NP 09/15/15 2039

## 2015-09-15 NOTE — Discharge Instructions (Signed)
You need to get your prescription for prednisone filled and soon as possible. It should be relatively inexpensive. Use your inhaler 2 puffs every 4 hours as needed for wheezing. Obtain a primary care doctor as soon as possible. Call one of the telephone numbers listed in your instruction sheets to find a doctor to help manage your asthma. Read your instructions carefully.  Asthma Attack Prevention While you may not be able to control the fact that you have asthma, you can take actions to prevent asthma attacks. The best way to prevent asthma attacks is to maintain good control of your asthma. You can achieve this by:  Taking your medicines as directed.  Avoiding things that can irritate your airways or make your asthma symptoms worse (asthma triggers).  Keeping track of how well your asthma is controlled and of any changes in your symptoms.  Responding quickly to worsening asthma symptoms (asthma attack).  Seeking emergency care when it is needed. WHAT ARE SOME WAYS TO PREVENT AN ASTHMA ATTACK? Have a Plan Work with your health care provider to create a written plan for managing and treating your asthma attacks (asthma action plan). This plan includes:  A list of your asthma triggers and how you can avoid them.  Information on when medicines should be taken and when their dosages should be changed.  The use of a device that measures how well your lungs are working (peak flow meter). Monitor Your Asthma Use your peak flow meter and record your results in a journal every day. A drop in your peak flow numbers on one or more days may indicate the start of an asthma attack. This can happen even before you start to feel symptoms. You can prevent an asthma attack from getting worse by following the steps in your asthma action plan. Avoid Asthma Triggers Work with your asthma health care provider to find out what your asthma triggers are. This can be done by:  Allergy testing.  Keeping a  journal that notes when asthma attacks occur and the factors that may have contributed to them.  Determining if there are other medical conditions that are making your asthma worse. Once you have determined your asthma triggers, take steps to avoid them. This may include avoiding excessive or prolonged exposure to:  Dust. Have someone dust and vacuum your home for you once or twice a week. Using a high-efficiency particulate arrestance (HEPA) vacuum is best.  Smoke. This includes campfire smoke, forest fire smoke, and secondhand smoke from tobacco products.  Pet dander. Avoid contact with animals that you know you are allergic to.  Allergens from trees, grasses or pollens. Avoid spending a lot of time outdoors when pollen counts are high, and on very windy days.  Very cold, dry, or humid air.  Mold.  Foods that contain high amounts of sulfites.  Strong odors.  Outdoor air pollutants, such as Museum/gallery exhibitions officer.  Indoor air pollutants, such as aerosol sprays and fumes from household cleaners.  Household pests, including dust mites and cockroaches, and pest droppings.  Certain medicines, including NSAIDs. Always talk to your health care provider before stopping or starting any new medicines. Medicines Take over-the-counter and prescription medicines only as told by your health care provider. Many asthma attacks can be prevented by carefully following your medicine schedule. Taking your medicines correctly is especially important when you cannot avoid certain asthma triggers. Act Quickly If an asthma attack does happen, acting quickly can decrease how severe it is and how long  it lasts. Take these steps:   Pay attention to your symptoms. If you are coughing, wheezing, or having difficulty breathing, do not wait to see if your symptoms go away on their own. Follow your asthma action plan.  If you have followed your asthma action plan and your symptoms are not improving, call your health  care provider or seek immediate medical care at the nearest hospital. It is important to note how often you need to use your fast-acting rescue inhaler. If you are using your rescue inhaler more often, it may mean that your asthma is not under control. Adjusting your asthma treatment plan may help you to prevent future asthma attacks and help you to gain better control of your condition. HOW CAN I PREVENT AN ASTHMA ATTACK WHEN I EXERCISE? Follow advice from your health care provider about whether you should use your fast-acting inhaler before exercising. Many people with asthma experience exercise-induced bronchoconstriction (EIB). This condition often worsens during vigorous exercise in cold, humid, or dry environments. Usually, people with EIB can stay very active by pre-treating with a fast-acting inhaler before exercising.   This information is not intended to replace advice given to you by your health care provider. Make sure you discuss any questions you have with your health care provider.   Document Released: 02/20/2009 Document Revised: 11/23/2014 Document Reviewed: 08/04/2014 Elsevier Interactive Patient Education 2016 Elsevier Inc.  Asthma, Acute Bronchospasm Acute bronchospasm caused by asthma is also referred to as an asthma attack. Bronchospasm means your air passages become narrowed. The narrowing is caused by inflammation and tightening of the muscles in the air tubes (bronchi) in your lungs. This can make it hard to breathe or cause you to wheeze and cough. CAUSES Possible triggers are:  Animal dander from the skin, hair, or feathers of animals.  Dust mites contained in house dust.  Cockroaches.  Pollen from trees or grass.  Mold.  Cigarette or tobacco smoke.  Air pollutants such as dust, household cleaners, hair sprays, aerosol sprays, paint fumes, strong chemicals, or strong odors.  Cold air or weather changes. Cold air may trigger inflammation. Winds increase molds  and pollens in the air.  Strong emotions such as crying or laughing hard.  Stress.  Certain medicines such as aspirin or beta-blockers.  Sulfites in foods and drinks, such as dried fruits and wine.  Infections or inflammatory conditions, such as a flu, cold, or inflammation of the nasal membranes (rhinitis).  Gastroesophageal reflux disease (GERD). GERD is a condition where stomach acid backs up into your esophagus.  Exercise or strenuous activity. SIGNS AND SYMPTOMS   Wheezing.  Excessive coughing, particularly at night.  Chest tightness.  Shortness of breath. DIAGNOSIS  Your health care provider will ask you about your medical history and perform a physical exam. A chest X-ray or blood testing may be performed to look for other causes of your symptoms or other conditions that may have triggered your asthma attack. TREATMENT  Treatment is aimed at reducing inflammation and opening up the airways in your lungs. Most asthma attacks are treated with inhaled medicines. These include quick relief or rescue medicines (such as bronchodilators) and controller medicines (such as inhaled corticosteroids). These medicines are sometimes given through an inhaler or a nebulizer. Systemic steroid medicine taken by mouth or given through an IV tube also can be used to reduce the inflammation when an attack is moderate or severe. Antibiotic medicines are only used if a bacterial infection is present.  HOME CARE INSTRUCTIONS  Rest.  Drink plenty of liquids. This helps the mucus to remain thin and be easily coughed up. Only use caffeine in moderation and do not use alcohol until you have recovered from your illness.  Do not smoke. Avoid being exposed to secondhand smoke.  You play a critical role in keeping yourself in good health. Avoid exposure to things that cause you to wheeze or to have breathing problems.  Keep your medicines up-to-date and available. Carefully follow your health care  provider's treatment plan.  Take your medicine exactly as prescribed.  When pollen or pollution is bad, keep windows closed and use an air conditioner or go to places with air conditioning.  Asthma requires careful medical care. See your health care provider for a follow-up as advised. If you are more than [redacted] weeks pregnant and you were prescribed any new medicines, let your obstetrician know about the visit and how you are doing. Follow up with your health care provider as directed.  After you have recovered from your asthma attack, make an appointment with your outpatient doctor to talk about ways to reduce the likelihood of future attacks. If you do not have a doctor who manages your asthma, make an appointment with a primary care doctor to discuss your asthma. SEEK IMMEDIATE MEDICAL CARE IF:   You are getting worse.  You have trouble breathing. If severe, call your local emergency services (911 in the [place]  You develop chest pain or discomfort.  You are vomiting.  You are not able to keep fluids down.  You are coughing up yellow, green, brown, or bloody sputum.  You have a fever and your symptoms suddenly get worse.  You have trouble swallowing. MAKE SURE YOU:   Understand these instructions.  Will watch your condition.  Will get help right away if you are not doing well or get worse.   This information is not intended to replace advice given to you by your health care provider. Make sure you discuss any questions you have with your health care provider.   Document Released: 06/19/2006 Document Revised: 03/09/2013 Document Reviewed: 09/09/2012 Elsevier Interactive Patient Education [employer] Inc[employer]

## 2015-09-15 NOTE — ED Notes (Signed)
Pt c/o worsening asthma symptoms today despite using his rescue inhaler. He denies fevers, cough, congestion. He is alert and breathing easily

## 2015-10-11 ENCOUNTER — Ambulatory Visit (HOSPITAL_COMMUNITY)
Admission: EM | Admit: 2015-10-11 | Discharge: 2015-10-11 | Disposition: A | Discharge status: Home / Self Care | Payer: BLUE CROSS/BLUE SHIELD | Attending: Family Medicine | Admitting: Family Medicine

## 2015-10-11 ENCOUNTER — Encounter (HOSPITAL_COMMUNITY): Payer: Self-pay | Admitting: *Deleted

## 2015-10-11 DIAGNOSIS — J45901 Unspecified asthma with (acute) exacerbation: Secondary | ICD-10-CM | POA: Diagnosis not present

## 2015-10-11 HISTORY — DX: Bipolar disorder, unspecified: F31.9

## 2015-10-11 HISTORY — DX: Major depressive disorder, single episode, unspecified: F32.9

## 2015-10-11 HISTORY — DX: Depression, unspecified: F32.A

## 2015-10-11 HISTORY — DX: Attention-deficit hyperactivity disorder, unspecified type: F90.9

## 2015-10-11 MED ORDER — ALBUTEROL SULFATE (2.5 MG/3ML) 0.083% IN NEBU
5.0000 mg | INHALATION_SOLUTION | Freq: Once | RESPIRATORY_TRACT | Status: AC
  Administered 2015-10-11: 5 mg via RESPIRATORY_TRACT

## 2015-10-11 MED ORDER — METHYLPREDNISOLONE SODIUM SUCC 125 MG IJ SOLR
INTRAMUSCULAR | Status: AC
  Filled 2015-10-11: qty 2

## 2015-10-11 MED ORDER — IPRATROPIUM-ALBUTEROL 0.5-2.5 (3) MG/3ML IN SOLN
RESPIRATORY_TRACT | Status: AC
  Filled 2015-10-11: qty 3

## 2015-10-11 MED ORDER — IPRATROPIUM BROMIDE 0.02 % IN SOLN
0.5000 mg | Freq: Once | RESPIRATORY_TRACT | Status: AC
  Administered 2015-10-11: 0.5 mg via RESPIRATORY_TRACT

## 2015-10-11 MED ORDER — SODIUM CHLORIDE 0.9 % IN NEBU
INHALATION_SOLUTION | RESPIRATORY_TRACT | Status: AC
  Filled 2015-10-11: qty 3

## 2015-10-11 MED ORDER — PREDNISONE 50 MG PO TABS: ORAL_TABLET | ORAL | 0 refills | Status: DC

## 2015-10-11 MED ORDER — ALBUTEROL SULFATE (2.5 MG/3ML) 0.083% IN NEBU
INHALATION_SOLUTION | RESPIRATORY_TRACT | Status: AC
  Filled 2015-10-11: qty 3

## 2015-10-11 MED ORDER — METHYLPREDNISOLONE SODIUM SUCC 125 MG IJ SOLR
125.0000 mg | Freq: Once | INTRAMUSCULAR | Status: AC
  Administered 2015-10-11: 125 mg via INTRAMUSCULAR

## 2015-10-11 NOTE — ED Triage Notes (Signed)
Patient reports sob upon waking this am, hx of asthma, used rescue inhaler with no relief. Denies fever or cough.

## 2015-10-11 NOTE — ED Provider Notes (Signed)
[facility]    CSN: [id number] Arrival date & time: 10/11/15  1708  First Provider Contact:  First MD Initiated Contact with Patient 10/11/15 1804        History   Chief Complaint Chief Complaint  Patient presents with  . Asthma    HPI Patrick Clements is a 25 y.o. male.    Shortness of Breath  Severity:  Moderate Onset quality:  Sudden Duration:  8 hours Progression:  Improving Chronicity:  Chronic Context: known allergens and weather changes   Relieved by:  Inhaler Associated symptoms: wheezing   Associated symptoms: no abdominal pain, no chest pain, no fever and no sore throat   Risk factors: no tobacco use     Past Medical History:  Diagnosis Date  . ADHD (attention deficit hyperactivity disorder)   . Asthma   . Bipolar 1 disorder (HCC)   . Depression     Patient Active Problem List   Diagnosis Date Noted  . PNEUMONIA, RIGHT MIDDLE LOBE 11/13/2007  . ASTHMA 11/13/2007  . 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 No pertinent surgical history.     Home Medications    Prior to Admission medications   Medication Sig Start Date End Date Taking? Authorizing Provider  albuterol (PROVENTIL HFA;VENTOLIN HFA) 108 (90 Base) MCG/ACT inhaler Inhale 1-2 puffs into the lungs every 6 (six) hours as needed for wheezing or shortness of breath. 09/02/15  Yes Derwood Kaplan, MD  methylphenidate (RITALIN) 10 MG tablet Take 10 mg by mouth 2 (two) times daily.   Yes Historical Provider, MD  sertraline (ZOLOFT) 100 MG tablet Take 100 mg by mouth daily.   Yes Historical Provider, MD  albuterol (PROVENTIL HFA;VENTOLIN HFA) 108 (90 Base) MCG/ACT inhaler Inhale 2 puffs into the lungs every 4 (four) hours as needed for wheezing or shortness of breath. 09/13/15   Deatra Canter, FNP  benzonatate (TESSALON) 100 MG capsule Take 1 capsule (100 mg total) by mouth every 8 (eight) hours. Patient not taking: Reported on 09/02/2015 06/03/15   Ace Gins Sam, PA-C  predniSONE  (DELTASONE) 20 MG tablet Take 2 tablets (40 mg total) by mouth daily. 09/02/15   Derwood Kaplan, MD  predniSONE (STERAPRED UNI-PAK 21 TAB) 10 MG (21) TBPK tablet Take 1 tablet (10 mg total) by mouth daily. Take 6-5-4-3-2-1 po qd 09/13/15   Deatra Canter, FNP    Family History History reviewed. No pertinent family history.  Social History Social History  Substance Use Topics  . Smoking status: Passive Smoke Exposure - Never Smoker  . Smokeless tobacco: Never Used  . Alcohol use Yes     Allergies   Review of patient's allergies indicates no known allergies.   Review of Systems Review of Systems  Constitutional: Negative.  Negative for fever.  HENT: Negative for congestion, postnasal drip, rhinorrhea and sore throat.   Respiratory: Positive for shortness of breath and wheezing.   Cardiovascular: Negative for chest pain.  Gastrointestinal: Negative for abdominal pain.  All other systems reviewed and are negative.    Physical Exam Triage Vital Signs ED Triage Vitals [10/11/15 1738]  Enc Vitals Group     BP 138/82     Pulse Rate 79     Resp 18     Temp 98.2 F (36.8 C)     Temp Source Oral     SpO2 96 %     Weight 257 lb (116.6 kg)     Height  (1.676 m)  Head Circumference      Peak Flow      Pain Score      Pain Loc      Pain Edu?      Excl. in GC?    No data found.   Updated Vital Signs BP 138/82 (BP Location: Left Arm)   Pulse 79   Temp 98.2 F (36.8 C) (Oral)   Resp 18   Ht 5\' 6"  (1.676 m)   Wt 257 lb (116.6 kg)   SpO2 96%   BMI 41.48 kg/m   Visual Acuity Right Eye Distance:   Left Eye Distance:   Bilateral Distance:    Right Eye Near:   Left Eye Near:    Bilateral Near:     Physical Exam  Constitutional: He is oriented to person, place, and time. He appears well-developed and well-nourished.  Neck: Normal range of motion. Neck supple.  Cardiovascular: Normal rate, regular rhythm, normal heart sounds and intact distal pulses.     Pulmonary/Chest: Effort normal. He has wheezes.  Neurological: He is alert and oriented to person, place, and time.  Nursing note and vitals reviewed.    UC Treatments / Results  Labs (all labs ordered are listed, but only abnormal results are displayed) Labs Reviewed - No data to display  EKG  EKG Interpretation None       Radiology No results found.  Procedures Procedures (including critical care time)  Medications Ordered in UC Medications  methylPREDNISolone sodium succinate (SOLU-MEDROL) 125 mg/2 mL injection 125 mg (not administered)  albuterol (PROVENTIL) (2.5 MG/3ML) 0.083% nebulizer solution 5 mg (not administered)  ipratropium (ATROVENT) nebulizer solution 0.5 mg (not administered)     Initial Impression / Assessment and Plan / UC Course  I have reviewed the triage vital signs and the nursing notes.  Pertinent labs & imaging results that were available during my care of the patient were reviewed by me and considered in my medical decision making (see chart for details).  Clinical Course  sx improved and lungs clear after neb     Final Clinical Impressions(s) / UC Diagnoses   Final diagnoses:  None    New Prescriptions New Prescriptions   No medications on file     Linna Hoff, MD 10/11/15 1818

## 2016-06-17 ENCOUNTER — Ambulatory Visit (INDEPENDENT_AMBULATORY_CARE_PROVIDER_SITE_OTHER): Payer: BLUE CROSS/BLUE SHIELD

## 2016-06-17 ENCOUNTER — Encounter (HOSPITAL_COMMUNITY): Payer: Self-pay | Admitting: Emergency Medicine

## 2016-06-17 ENCOUNTER — Ambulatory Visit (HOSPITAL_COMMUNITY)
Admission: EM | Admit: 2016-06-17 | Discharge: 2016-06-17 | Disposition: A | Discharge status: Home / Self Care | Payer: BLUE CROSS/BLUE SHIELD | Attending: Family Medicine | Admitting: Family Medicine

## 2016-06-17 DIAGNOSIS — F319 Bipolar disorder, unspecified: Secondary | ICD-10-CM | POA: Diagnosis not present

## 2016-06-17 DIAGNOSIS — F909 Attention-deficit hyperactivity disorder, unspecified type: Secondary | ICD-10-CM | POA: Insufficient documentation

## 2016-06-17 DIAGNOSIS — F329 Major depressive disorder, single episode, unspecified: Secondary | ICD-10-CM | POA: Diagnosis not present

## 2016-06-17 DIAGNOSIS — Z7722 Contact with and (suspected) exposure to environmental tobacco smoke (acute) (chronic): Secondary | ICD-10-CM | POA: Diagnosis not present

## 2016-06-17 DIAGNOSIS — R0602 Shortness of breath: Secondary | ICD-10-CM | POA: Diagnosis not present

## 2016-06-17 DIAGNOSIS — R0789 Other chest pain: Secondary | ICD-10-CM

## 2016-06-17 DIAGNOSIS — J4551 Severe persistent asthma with (acute) exacerbation: Secondary | ICD-10-CM

## 2016-06-17 LAB — CBC WITH DIFFERENTIAL/PLATELET
BASOS ABS: 0 10*3/uL (ref 0.0–0.1)
Basophils Relative: 0 %
EOS PCT: 5 %
Eosinophils Absolute: 0.4 10*3/uL (ref 0.0–0.7)
HCT: 39.2 % (ref 39.0–52.0)
Hemoglobin: 13.2 g/dL (ref 13.0–17.0)
LYMPHS PCT: 32 %
Lymphs Abs: 2.7 10*3/uL (ref 0.7–4.0)
MCH: 27.6 pg (ref 26.0–34.0)
MCHC: 33.7 g/dL (ref 30.0–36.0)
MCV: 82 fL (ref 78.0–100.0)
Monocytes Absolute: 0.7 10*3/uL (ref 0.1–1.0)
Monocytes Relative: 8 %
Neutro Abs: 4.8 10*3/uL (ref 1.7–7.7)
Neutrophils Relative %: 55 %
PLATELETS: 225 10*3/uL (ref 150–400)
RBC: 4.78 MIL/uL (ref 4.22–5.81)
RDW: 12.8 % (ref 11.5–15.5)
WBC: 8.5 10*3/uL (ref 4.0–10.5)

## 2016-06-17 LAB — POCT I-STAT, CHEM 8
BUN: 15 mg/dL (ref 6–20)
CHLORIDE: 104 mmol/L (ref 101–111)
CREATININE: 1.2 mg/dL (ref 0.61–1.24)
Calcium, Ion: 1.19 mmol/L (ref 1.15–1.40)
Glucose, Bld: 97 mg/dL (ref 65–99)
HCT: 42 % (ref 39.0–52.0)
Hemoglobin: 14.3 g/dL (ref 13.0–17.0)
POTASSIUM: 4.1 mmol/L (ref 3.5–5.1)
SODIUM: 141 mmol/L (ref 135–145)
TCO2: 25 mmol/L (ref 0–100)

## 2016-06-17 MED ORDER — DEXAMETHASONE SODIUM PHOSPHATE 10 MG/ML IJ SOLN
10.0000 mg | Freq: Once | INTRAMUSCULAR | Status: AC
  Administered 2016-06-17: 10 mg via INTRAMUSCULAR

## 2016-06-17 MED ORDER — IPRATROPIUM-ALBUTEROL 0.5-2.5 (3) MG/3ML IN SOLN
3.0000 mL | Freq: Once | RESPIRATORY_TRACT | Status: AC
  Administered 2016-06-17: 3 mL via RESPIRATORY_TRACT

## 2016-06-17 MED ORDER — PREDNISONE 50 MG PO TABS: ORAL_TABLET | ORAL | 0 refills | Status: DC

## 2016-06-17 MED ORDER — DEXAMETHASONE SODIUM PHOSPHATE 10 MG/ML IJ SOLN
INTRAMUSCULAR | Status: AC
  Filled 2016-06-17: qty 1

## 2016-06-17 MED ORDER — IPRATROPIUM-ALBUTEROL 0.5-2.5 (3) MG/3ML IN SOLN
RESPIRATORY_TRACT | Status: AC
  Filled 2016-06-17: qty 3

## 2016-06-17 MED ORDER — ALBUTEROL SULFATE HFA 108 (90 BASE) MCG/ACT IN AERS: 1.0000 | INHALATION_SPRAY | Freq: Four times a day (QID) | RESPIRATORY_TRACT | 3 refills | Status: DC | PRN

## 2016-06-17 MED ORDER — BECLOMETHASONE DIPROPIONATE 40 MCG/ACT IN AERS: 1.0000 | INHALATION_SPRAY | Freq: Two times a day (BID) | RESPIRATORY_TRACT | 3 refills | Status: DC

## 2016-06-17 NOTE — ED Triage Notes (Signed)
The patient presented to the [facility] with a complaint of SOB and asthma exacerbation that started 2 days ago. The patient reported that he used his Rescue inhaler last night but does not have any more.

## 2016-06-17 NOTE — Discharge Instructions (Signed)
I have treated you for an asthma exacerbation. You have had a DuoNeb treatment, and dexamethasone injection in clinic. Your laboratory and x-ray findings were within normal limits, I have refilled her albuterol inhaler, I have started you on a maintenance medication called Qvar, take 1 puff 2 times daily, rinse your mouth out after use. I have also started you on prednisone, take one tablet daily with food. I highly recommend you establish with a primary care provider, who can manage your condition. I also highly recommend you refrain from smoking as well.

## 2016-06-17 NOTE — ED Provider Notes (Signed)
CSN: [id number]     Arrival date & time 06/17/16  1059 History   First MD Initiated Contact with Patient 06/17/16 1115     Chief Complaint  Patient presents with  . Shortness of Breath   (Consider location/radiation/quality/duration/timing/severity/associated sxs/prior Treatment) 26 year old male presents to clinic with a 2 day history of an asthma exacerbation. States he does have past history of asthma, he is not taking a daily controller medication, denies any recent history of URI, flu, cold, or other infectious processes, he is not taking a daily controller medication. States he used the last of his rescue inhaler last night.   The history is provided by the patient.  Shortness of Breath  Severity:  Severe Onset quality:  Gradual Duration:  2 days Timing:  Constant Progression:  Worsening Chronicity:  New Context: activity and smoke exposure   Context: not fumes, not known allergens, not URI and not weather changes   Relieved by:  Rest and inhaler Worsened by:  Coughing, activity, deep breathing and exertion Associated symptoms: chest pain, cough, sputum production and wheezing   Associated symptoms: no fever, no neck pain, no sore throat and no vomiting     Past Medical History:  Diagnosis Date  . ADHD (attention deficit hyperactivity disorder)   . Asthma   . Bipolar 1 disorder (HCC)   . Depression    History reviewed. No pertinent surgical history. History reviewed. No pertinent family history. Social History  Substance Use Topics  . Smoking status: Passive Smoke Exposure - Never Smoker  . Smokeless tobacco: Never Used  . Alcohol use Yes    Review of Systems  Constitutional: Negative for chills and fever.  HENT: Negative for congestion, rhinorrhea, sinus pain, sinus pressure and sore throat.   Respiratory: Positive for cough, sputum production, chest tightness, shortness of breath and wheezing.   Cardiovascular: Positive for chest pain.  Gastrointestinal:  Negative for constipation, diarrhea and vomiting.  Musculoskeletal: Negative for neck pain and neck stiffness.  Neurological: Negative for dizziness and weakness.    Allergies  Patient has no known allergies.  Home Medications   Prior to Admission medications   Medication Sig Start Date End Date Taking? Authorizing Provider  sertraline (ZOLOFT) 100 MG tablet Take 100 mg by mouth daily.   Yes Historical Provider, MD  albuterol (PROVENTIL HFA;VENTOLIN HFA) 108 (90 Base) MCG/ACT inhaler Inhale 1-2 puffs into the lungs every 6 (six) hours as needed for wheezing or shortness of breath. 06/17/16   Dorena Bodo, NP  beclomethasone (QVAR) 40 MCG/ACT inhaler Inhale 1 puff into the lungs 2 (two) times daily. 06/17/16   Dorena Bodo, NP  predniSONE (DELTASONE) 50 MG tablet Take 1 tablet daily with food 06/17/16   Dorena Bodo, NP   Meds Ordered and Administered this Visit   Medications  ipratropium-albuterol (DUONEB) 0.5-2.5 (3) MG/3ML nebulizer solution 3 mL (3 mLs Nebulization Given 06/17/16 1113)  dexamethasone (DECADRON) injection 10 mg (10 mg Intramuscular Given 06/17/16 1113)    BP 135/78   Pulse 80   Temp 98.8 F (37.1 C) (Oral)   Resp 18   SpO2 98%  No data found.   Physical Exam  Constitutional: He is oriented to person, place, and time. He appears well-developed and well-nourished. No distress.  HENT:  Head: Normocephalic and atraumatic.  Right Ear: External ear normal.  Left Ear: External ear normal.  Eyes: EOM are normal. Pupils are equal, round, and reactive to light.  Neck: Normal range of motion. No JVD present.  Cardiovascular: Normal rate and regular rhythm.   Pulmonary/Chest: He is in respiratory distress. He has decreased breath sounds. He has wheezes. He has no rhonchi.  Abdominal: Soft. Bowel sounds are normal.  Lymphadenopathy:    He has no cervical adenopathy.  Neurological: He is alert and oriented to person, place, and time.  Skin: Skin is warm and dry.  Capillary refill takes less than 2 seconds. He is not diaphoretic.  Psychiatric: He has a normal mood and affect. His behavior is normal.  Nursing note and vitals reviewed.   Urgent Care Course     ED EKG Date/Time: 06/17/2016 4:10 PM Performed by: Dorena Bodo Authorized by: Dorena Bodo   ECG reviewed by ED Physician in the absence of a cardiologist: no   Previous ECG:    Previous ECG:  Unavailable Interpretation:    Interpretation: normal   Rate:    ECG rate:  79   ECG rate assessment: normal   Rhythm:    Rhythm: sinus rhythm   Ectopy:    Ectopy: none   QRS:    QRS axis:  Right   QRS intervals:  Normal Conduction:    Conduction: normal   ST segments:    ST segments:  Normal T waves:    T waves: normal   Comments:     PR 168 ms QRS 94 ms QT/QTc 354/405 ms   (including critical care time)  Labs Review Labs Reviewed  CBC WITH DIFFERENTIAL/PLATELET  POCT I-STAT, CHEM 8    Imaging Review Dg Chest 2 View  Result Date: 06/17/2016 CLINICAL DATA:  Asthma exacerbation EXAM: CHEST  2 VIEW COMPARISON:  09/02/2015 FINDINGS: The heart size and mediastinal contours are within normal limits. Both lungs are clear. The visualized skeletal structures are unremarkable. IMPRESSION: No active cardiopulmonary disease. Electronically Signed   By: Marlan Palau M.D.   On: 06/17/2016 11:35          MDM   1. Severe persistent asthma with exacerbation     Patient treated for acute asthma exacerbation, was given DuoNeb, dexamethasone in clinic, chest x-ray obtained, no active cardiopulmonary disease on x-ray.CBC and I-stat within normal limits, 12 lead negative for ST elevation or arrhythmia, right axis deviation, consistent with long term pulmonary disease. Discharged home with refill of albuterol inhaler, started on Qvar and prednisone and encouraged to take an OTC antihistamine daily. Follow up with primary care for management of asthma, go to the ER at anytime  symptoms return or worsen.    Dorena Bodo, NP 06/17/16 [phone]

## 2016-07-19 ENCOUNTER — Encounter (HOSPITAL_COMMUNITY): Payer: Self-pay | Admitting: Emergency Medicine

## 2016-07-19 ENCOUNTER — Ambulatory Visit (HOSPITAL_COMMUNITY)
Admission: EM | Admit: 2016-07-19 | Discharge: 2016-07-19 | Disposition: A | Discharge status: Home / Self Care | Payer: BLUE CROSS/BLUE SHIELD | Attending: Internal Medicine | Admitting: Internal Medicine

## 2016-07-19 DIAGNOSIS — M7661 Achilles tendinitis, right leg: Secondary | ICD-10-CM

## 2016-07-19 MED ORDER — NAPROXEN 500 MG PO TABS: 500.0000 mg | ORAL_TABLET | Freq: Two times a day (BID) | ORAL | 0 refills | Status: DC

## 2016-07-19 NOTE — ED Triage Notes (Signed)
[facility] a [facility] chronic left ankle pain. The patient stated that he was here to get a steroid shot.

## 2016-07-19 NOTE — ED Provider Notes (Signed)
CSN: [id number]     Arrival date & time 07/19/16  1954 History   None    Chief Complaint  Patient presents with  . Ankle Pain   (Consider location/radiation/quality/duration/timing/severity/associated sxs/prior Treatment) Patient c/o left ankle pain for 2 days.   The history is provided by the patient.  Ankle Pain  Location:  Ankle Time since incident:  2 days Injury: no   Ankle location:  L ankle Pain details:    Quality:  Aching   Radiates to:  Does not radiate   Severity:  Moderate   Onset quality:  Sudden   Duration:  2 days   Timing:  Constant Chronicity:  New Dislocation: no   Foreign body present:  Unable to specify Tetanus status:  Unknown   Past Medical History:  Diagnosis Date  . ADHD (attention deficit hyperactivity disorder)   . Asthma   . Bipolar 1 disorder (HCC)   . Depression    History reviewed. No pertinent surgical history. History reviewed. No pertinent family history. Social History  Substance Use Topics  . Smoking status: Passive Smoke Exposure - Never Smoker  . Smokeless tobacco: Never Used  . Alcohol use Yes    Review of Systems  Constitutional: Negative.   HENT: Negative.   Eyes: Negative.   Respiratory: Negative.   Cardiovascular: Negative.   Gastrointestinal: Negative.   Endocrine: Negative.   Genitourinary: Negative.   Musculoskeletal: Positive for arthralgias.  Allergic/Immunologic: Negative.   Neurological: Negative.   Hematological: Negative.     Allergies  Patient has no known allergies.  Home Medications   Prior to Admission medications   Medication Sig Start Date End Date Taking? Authorizing Provider  naproxen (NAPROSYN) 500 MG tablet Take 1 tablet (500 mg total) by mouth 2 (two) times daily with a meal. 07/19/16   Deatra CanterWilliam J Umeka Wrench, FNP   Meds Ordered and Administered this Visit  Medications - No data to display  BP 132/72 (BP Location: Right Arm)   Pulse 79   Temp 98.2 F (36.8 C) (Oral)   Resp 18   SpO2 99%   No data found.   Physical Exam  Constitutional: He is oriented to person, place, and time. He appears well-developed and well-nourished.  HENT:  Head: Normocephalic and atraumatic.  Eyes: Conjunctivae and EOM are normal. Pupils are equal, round, and reactive to light.  Neck: Normal range of motion. Neck supple.  Cardiovascular: Normal rate, regular rhythm and normal heart sounds.   Pulmonary/Chest: Effort normal.  Abdominal: Soft. Bowel sounds are normal.  Neurological: He is alert and oriented to person, place, and time.  Nursing note and vitals reviewed.   Urgent Care Course     Procedures (including critical care time)  Labs Review Labs Reviewed - No data to display  Imaging Review No results found.   Visual Acuity Review  Right Eye Distance:   Left Eye Distance:   Bilateral Distance:    Right Eye Near:   Left Eye Near:    Bilateral Near:         MDM   1. Tendonitis, Achilles, right     Naprosyn 500mg  one po bid x 10 days #20     Deatra CanterWilliam J Derian Dimalanta, FNP 07/19/16 2123

## 2016-07-22 ENCOUNTER — Ambulatory Visit (HOSPITAL_COMMUNITY)
Admission: EM | Admit: 2016-07-22 | Discharge: 2016-07-22 | Disposition: A | Discharge status: Home / Self Care | Payer: BLUE CROSS/BLUE SHIELD | Attending: Internal Medicine | Admitting: Internal Medicine

## 2016-07-22 ENCOUNTER — Encounter (HOSPITAL_COMMUNITY): Payer: Self-pay | Admitting: *Deleted

## 2016-07-22 DIAGNOSIS — M25571 Pain in right ankle and joints of right foot: Secondary | ICD-10-CM | POA: Diagnosis not present

## 2016-07-22 DIAGNOSIS — Z760 Encounter for issue of repeat prescription: Secondary | ICD-10-CM

## 2016-07-22 MED ORDER — ALBUTEROL SULFATE HFA 108 (90 BASE) MCG/ACT IN AERS: 1.0000 | INHALATION_SPRAY | Freq: Four times a day (QID) | RESPIRATORY_TRACT | 0 refills | Status: DC | PRN

## 2016-07-22 MED ORDER — KETOROLAC TROMETHAMINE 60 MG/2ML IM SOLN
60.0000 mg | Freq: Once | INTRAMUSCULAR | Status: AC
  Administered 2016-07-22: 60 mg via INTRAMUSCULAR

## 2016-07-22 MED ORDER — KETOROLAC TROMETHAMINE 60 MG/2ML IM SOLN
INTRAMUSCULAR | Status: AC
  Filled 2016-07-22: qty 2

## 2016-07-22 NOTE — Discharge Instructions (Signed)
I have refilled your albuterol inhaler as requested.  For your ankle pain, you have been given an injection of Toradol here in clinic. I recommend you fill the prescription that was prescribed 3 days ago, and following the directions as given by the provider. If your pain persist, follow-up with orthopedics

## 2016-07-22 NOTE — ED Triage Notes (Addendum)
Pt   Has    r   Ankle  Tendonitis     Seen  Here  3  Days      Ago   Did  Not  Get   rx  Filled    Pain  On   Weight  Bearing

## 2016-07-22 NOTE — ED Provider Notes (Signed)
CSN: [id number]     Arrival date & time 07/22/16  1558 History   First MD Initiated Contact with Patient 07/22/16 1733     Chief Complaint  Patient presents with  . Ankle Pain   (Consider location/radiation/quality/duration/timing/severity/associated sxs/prior Treatment) 26 year old male presents to clinic with a chief complaint of right ankle pain. He was seen in clinic 3 days ago, diagnosed with ankle tendinitis, prescribed naproxen, but he did not have the medicine field, he has tried no over-the-counter medicines, and no therapies. Pain is consistent with previous symptoms, is requesting something for pain.  He is also requesting refill on his albuterol inhaler.   The history is provided by the patient.  Ankle Pain    Past Medical History:  Diagnosis Date  . ADHD (attention deficit hyperactivity disorder)   . Asthma   . Bipolar 1 disorder (HCC)   . Depression    History reviewed. No pertinent surgical history. History reviewed. No pertinent family history. Social History  Substance Use Topics  . Smoking status: Passive Smoke Exposure - Never Smoker  . Smokeless tobacco: Never Used  . Alcohol use Yes    Review of Systems  Constitutional: Negative.   HENT: Negative.   Respiratory: Negative.   Cardiovascular: Negative.   Musculoskeletal:       Ankle pain  Skin: Negative.     Allergies  Patient has no known allergies.  Home Medications   Prior to Admission medications   Medication Sig Start Date End Date Taking? Authorizing Provider  albuterol (PROVENTIL HFA;VENTOLIN HFA) 108 (90 Base) MCG/ACT inhaler Inhale 1-2 puffs into the lungs every 6 (six) hours as needed for wheezing or shortness of breath. 07/22/16   Dorena BodoKennard, Ciella Obi, NP  naproxen (NAPROSYN) 500 MG tablet Take 1 tablet (500 mg total) by mouth 2 (two) times daily with a meal. 07/19/16   [place], Anselm PancoastWilliam J, FNP   Meds Ordered and Administered this Visit   Medications  ketorolac (TORADOL) injection 60 mg (60  mg Intramuscular Given 07/22/16 1742)    There were no vitals taken for this visit. No data found.   Physical Exam  Constitutional: He is oriented to person, place, and time. He appears well-developed and well-nourished. No distress.  HENT:  Head: Normocephalic and atraumatic.  Right Ear: External ear normal.  Left Ear: External ear normal.  Eyes: Conjunctivae are normal.  Neck: Normal range of motion.  Cardiovascular: Regular rhythm.   Musculoskeletal: He exhibits tenderness. He exhibits no deformity.       Right ankle: He exhibits decreased range of motion and swelling. He exhibits no deformity. Tenderness. No lateral malleolus and no medial malleolus tenderness found. Achilles tendon exhibits no pain.  Neurological: He is alert and oriented to person, place, and time.  Skin: Skin is warm and dry. Capillary refill takes less than 2 seconds. He is not diaphoretic.  Psychiatric: He has a normal mood and affect. His behavior is normal.  Nursing note and vitals reviewed.   Urgent Care Course     Procedures (including critical care time)  Labs Review Labs Reviewed - No data to display  Imaging Review No results found.      MDM   1. Acute right ankle pain   2. Medication refill    Albuterol inhaler refilled  Regard to ankle pain, given prescription for Toradol, advised to have this prescription filled from his previous visit. Pain persist follow up with primary care, or orthopedics.    Dorena BodoKennard, Cherryl Babin, NP 07/22/16 1935

## 2016-09-03 ENCOUNTER — Ambulatory Visit (HOSPITAL_COMMUNITY)
Admission: EM | Admit: 2016-09-03 | Discharge: 2016-09-03 | Disposition: A | Discharge status: Home / Self Care | Payer: BLUE CROSS/BLUE SHIELD | Attending: Family Medicine | Admitting: Family Medicine

## 2016-09-03 ENCOUNTER — Encounter (HOSPITAL_COMMUNITY): Payer: Self-pay | Admitting: Family Medicine

## 2016-09-03 DIAGNOSIS — M7661 Achilles tendinitis, right leg: Secondary | ICD-10-CM

## 2016-09-03 MED ORDER — NAPROXEN 500 MG PO TABS
500.0000 mg | ORAL_TABLET | Freq: Two times a day (BID) | ORAL | 0 refills | Status: DC
Start: 2016-09-03 — End: 2017-03-15

## 2016-09-03 NOTE — ED Triage Notes (Signed)
Pt here for chronic intermittent right ankle tendonitis and swelling since the weekend. sts that he is out of medication.

## 2016-09-04 NOTE — ED Provider Notes (Signed)
CSN: [id number]     Arrival date & time 09/03/16  1943 History   None    Chief Complaint  Patient presents with  . Joint Swelling   (Consider location/radiation/quality/duration/timing/severity/associated sxs/prior Treatment) Patient c/o right heel and achilles tendon pain.    The history is provided by the patient.  Foot Pain  This is a new problem. The problem occurs constantly. The problem has not changed since onset.Nothing aggravates the symptoms. Nothing relieves the symptoms.    Past Medical History:  Diagnosis Date  . ADHD (attention deficit hyperactivity disorder)   . Asthma   . Bipolar 1 disorder (HCC)   . Depression    History reviewed. No pertinent surgical history. History reviewed. No pertinent family history. Social History  Substance Use Topics  . Smoking status: Passive Smoke Exposure - Never Smoker  . Smokeless tobacco: Never Used  . Alcohol use Yes    Review of Systems  Constitutional: Negative.   HENT: Negative.   Eyes: Negative.   Respiratory: Negative.   Cardiovascular: Negative.   Gastrointestinal: Negative.   Endocrine: Negative.   Genitourinary: Negative.   Musculoskeletal: Positive for arthralgias.  Allergic/Immunologic: Negative.   Neurological: Negative.   Hematological: Negative.   Psychiatric/Behavioral: Negative.     Allergies  Patient has no known allergies.  Home Medications   Prior to Admission medications   Medication Sig Start Date End Date Taking? Authorizing Provider  albuterol (PROVENTIL HFA;VENTOLIN HFA) 108 (90 Base) MCG/ACT inhaler Inhale 1-2 puffs into the lungs every 6 (six) hours as needed for wheezing or shortness of breath. 07/22/16   Dorena BodoKennard, Lawrence, NP  naproxen (NAPROSYN) 500 MG tablet Take 1 tablet (500 mg total) by mouth 2 (two) times daily with a meal. 09/03/16   Henley Blyth, Anselm PancoastWilliam J, FNP   Meds Ordered and Administered this Visit  Medications - No data to display  BP 133/79 (BP Location: Right Arm)   Pulse  81   Temp 98.3 F (36.8 C) (Oral)   Resp 16   SpO2 97%  No data found.   Physical Exam  Constitutional: He appears well-developed and well-nourished.  HENT:  Head: Normocephalic and atraumatic.  Eyes: Conjunctivae and EOM are normal. Pupils are equal, round, and reactive to light.  Neck: Normal range of motion. Neck supple.  Cardiovascular: Normal rate, regular rhythm and normal heart sounds.   Pulmonary/Chest: Effort normal and breath sounds normal.  Musculoskeletal: He exhibits tenderness.  TTP right heel and achilles tendon negative thompson test  Nursing note and vitals reviewed.   Urgent Care Course     Procedures (including critical care time)  Labs Review Labs Reviewed - No data to display  Imaging Review No results found.   Visual Acuity Review  Right Eye Distance:   Left Eye Distance:   Bilateral Distance:    Right Eye Near:   Left Eye Near:    Bilateral Near:         MDM   1. Achilles tendinitis of right lower extremity    Naprosyn 500mg  one po bid x 10 days 320  Follow up with PCP and have them refer you to Ortho if not better.      Deatra CanterOxford, Arn Mcomber J, FNP 09/04/16 1002

## 2017-01-29 ENCOUNTER — Ambulatory Visit (HOSPITAL_COMMUNITY)
Admission: EM | Admit: 2017-01-29 | Discharge: 2017-01-29 | Disposition: A | Discharge status: Home / Self Care | Payer: BLUE CROSS/BLUE SHIELD | Attending: Family Medicine | Admitting: Family Medicine

## 2017-01-29 ENCOUNTER — Encounter (HOSPITAL_COMMUNITY): Payer: Self-pay

## 2017-01-29 ENCOUNTER — Other Ambulatory Visit: Payer: Self-pay

## 2017-01-29 DIAGNOSIS — J4521 Mild intermittent asthma with (acute) exacerbation: Secondary | ICD-10-CM

## 2017-01-29 DIAGNOSIS — R062 Wheezing: Secondary | ICD-10-CM

## 2017-01-29 MED ORDER — IPRATROPIUM-ALBUTEROL 0.5-2.5 (3) MG/3ML IN SOLN
RESPIRATORY_TRACT | Status: AC
  Filled 2017-01-29: qty 3

## 2017-01-29 MED ORDER — IPRATROPIUM-ALBUTEROL 0.5-2.5 (3) MG/3ML IN SOLN
3.0000 mL | Freq: Once | RESPIRATORY_TRACT | Status: AC
  Administered 2017-01-29: 3 mL via RESPIRATORY_TRACT

## 2017-01-29 MED ORDER — ALBUTEROL SULFATE HFA 108 (90 BASE) MCG/ACT IN AERS: 1.0000 | INHALATION_SPRAY | Freq: Four times a day (QID) | RESPIRATORY_TRACT | 0 refills | Status: DC | PRN

## 2017-01-29 NOTE — ED Provider Notes (Signed)
[facility]    CSN: [id number] Arrival date & time: 01/29/17  1815     History   Chief Complaint Chief Complaint  Patient presents with  . Asthma    breathing  treatment     HPI Patrick Clements is a 26 y.o. male.   26 year old male with history of asthma comes in for asthma exacerbation and medication refill. States that he has had on and off wheezing since his albuterol ran out awhile ago. Had increased in shortness of breath last night and came in for evaluation. He has intermittent cough. Denies nasal congestion, rhinorrhea, ear pain, eye pain. Denies fever, chills, night sweats. He is also looking for PCP for better maintenance of asthma. He occasionally smokes, last smoked yesterday.       Past Medical History:  Diagnosis Date  . ADHD (attention deficit hyperactivity disorder)   . Asthma   . Bipolar 1 disorder (HCC)   . Depression     Patient Active Problem List   Diagnosis Date Noted  . PNEUMONIA, RIGHT MIDDLE LOBE 11/13/2007  . ASTHMA 11/13/2007  . 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 No pertinent surgical history.     Home Medications    Prior to Admission medications   Medication Sig Start Date End Date Taking? Authorizing Provider  albuterol (PROVENTIL HFA;VENTOLIN HFA) 108 (90 Base) MCG/ACT inhaler Inhale 1-2 puffs every 6 (six) hours as needed into the lungs for wheezing or shortness of breath. 01/29/17   Cathie HoopsYu, Amy V, PA-C  naproxen (NAPROSYN) 500 MG tablet Take 1 tablet (500 mg total) by mouth 2 (two) times daily with a meal. 09/03/16   [place], Anselm PancoastWilliam J, FNP    Family History History reviewed. No pertinent family history.  Social History Social History   Tobacco Use  . Smoking status: Passive Smoke Exposure - Never Smoker  . Smokeless tobacco: Never Used  Substance Use Topics  . Alcohol use: Yes  . Drug use: No     Allergies   Patient has no known allergies.   Review of Systems Review of Systems  Reason unable to  perform ROS: See HPI as above.     Physical Exam Triage Vital Signs ED Triage Vitals [01/29/17 1850]  Enc Vitals Group     BP 128/70     Pulse Rate 78     Resp 16     Temp 98.2 F (36.8 C)     Temp Source Oral     SpO2 99 %     Weight      Height      Head Circumference      Peak Flow      Pain Score      Pain Loc      Pain Edu?      Excl. in GC?    No data found.  Updated Vital Signs BP 128/70 (BP Location: Left Arm)   Pulse 78   Temp 98.2 F (36.8 C) (Oral)   Resp 16   SpO2 99%   Physical Exam  Constitutional: He is oriented to person, place, and time. He appears well-developed and well-nourished. No distress.  HENT:  Head: Normocephalic and atraumatic.  Right Ear: Tympanic membrane, external ear and ear canal normal. Tympanic membrane is not erythematous and not bulging.  Left Ear: Tympanic membrane, external ear and ear canal normal. Tympanic membrane is not erythematous and not bulging.  Nose: Nose normal. Right sinus exhibits no maxillary sinus tenderness and  no frontal sinus tenderness. Left sinus exhibits no maxillary sinus tenderness and no frontal sinus tenderness.  Mouth/Throat: Uvula is midline, oropharynx is clear and moist and mucous membranes are normal.  Eyes: Conjunctivae are normal. Pupils are equal, round, and reactive to light.  Neck: Normal range of motion. Neck supple.  Cardiovascular: Normal rate, regular rhythm and normal heart sounds. Exam reveals no gallop and no friction rub.  No murmur heard. Pulmonary/Chest: Effort normal.  Mild expiratory wheezing in the periphery.   Lymphadenopathy:    He has no cervical adenopathy.  Neurological: He is alert and oriented to person, place, and time.  Skin: Skin is warm and dry.  Psychiatric: He has a normal mood and affect. His behavior is normal. Judgment normal.     UC Treatments / Results  Labs (all labs ordered are listed, but only abnormal results are displayed) Labs Reviewed - No data to  display  EKG  EKG Interpretation None       Radiology No results found.  Procedures Procedures (including critical care time)  Medications Ordered in UC Medications  ipratropium-albuterol (DUONEB) 0.5-2.5 (3) MG/3ML nebulizer solution 3 mL (3 mLs Nebulization Given 01/29/17 1937)     Initial Impression / Assessment and Plan / UC Course  I have reviewed the triage vital signs and the nursing notes.  Pertinent labs & imaging results that were available during my care of the patient were reviewed by me and considered in my medical decision making (see chart for details).    Lungs clear to auscultation bilaterally without adventitious lung sounds after duoneb treatment. Refilled albuterol. Long discussion on importance of PCP care for asthma management. Encouraged smoking cessation. Return precautions given.   Final Clinical Impressions(s) / UC Diagnoses   Final diagnoses:  Mild intermittent asthma with exacerbation    ED Discharge Orders        Ordered    albuterol (PROVENTIL HFA;VENTOLIN HFA) 108 (90 Base) MCG/ACT inhaler  Every 6 hours PRN     01/29/17 1945        Belinda FisherYu, Amy V, Cordelia Poche-C 01/29/17 2038

## 2017-01-29 NOTE — ED Triage Notes (Signed)
[facility], pt is requesting to have a breathing treatment and medication refill of Albuterol inhaler

## 2017-01-29 NOTE — Discharge Instructions (Signed)
I have refilled your albuterol inhaler. Please refrain from smoking, as it can worsen your symptoms. Follow up with primary care for further evaluation and management needed. I have submitted through our system to help you find a PCP. You can also call your insurance for any in network providers to seek PCP care. Monitor for any worsening of symptoms, chest pain, shortness of breath, wheezing, swelling of the throat, follow up for reevaluation.

## 2017-03-15 ENCOUNTER — Encounter (HOSPITAL_COMMUNITY): Payer: Self-pay | Admitting: Emergency Medicine

## 2017-03-15 ENCOUNTER — Other Ambulatory Visit: Payer: Self-pay

## 2017-03-15 ENCOUNTER — Ambulatory Visit (HOSPITAL_COMMUNITY)
Admission: EM | Admit: 2017-03-15 | Discharge: 2017-03-15 | Disposition: A | Discharge status: Home / Self Care | Payer: 59 | Attending: Family Medicine | Admitting: Family Medicine

## 2017-03-15 DIAGNOSIS — M7661 Achilles tendinitis, right leg: Secondary | ICD-10-CM | POA: Diagnosis not present

## 2017-03-15 MED ORDER — DICLOFENAC SODIUM 75 MG PO TBEC: 75.0000 mg | DELAYED_RELEASE_TABLET | Freq: Two times a day (BID) | ORAL | 0 refills | Status: DC

## 2017-03-15 NOTE — ED Triage Notes (Signed)
Pt c/o throbbing R ankle pain, states he has tendonitis. Denies injury. Ambulatory with steady gait.

## 2017-03-18 NOTE — ED Provider Notes (Signed)
  [facility]   [id number] 03/15/17 Arrival Time: 1842  ASSESSMENT & PLAN:  1. Achilles tendinitis of right lower extremity     Meds ordered this encounter  Medications  . diclofenac (VOLTAREN) 75 MG EC tablet    Sig: Take 1 tablet (75 mg total) by mouth 2 (two) times daily.    Dispense:  14 tablet    Refill:  0   WBAT. If this does not improve with relative rest and NSAID he may need a walking boot or an orthopaedic evaluation.  Reviewed expectations re: course of current medical issues. Questions answered. Outlined signs and symptoms indicating need for more acute intervention. Patient verbalized understanding. After Visit Summary given.   SUBJECTIVE: History from: patient. Patrick Clements is a 27 y.o. male who reports persistent pain of his R posterior ankle/foot. Gradual onset over the past few weeks. No injury/truama. Ambulatory without difficulty but prolonged standing/ambulation worsens "tight and sore" feeling of ankle/foot. No h/o similar symptoms. Occasional ibuprofen with mild help. No swelling or bruising noted. No extremity sensation changes or weakness. No ROM loss reported. Rest helps.  ROS: As per HPI.   OBJECTIVE:  Vitals:   03/15/17 1941  BP: (!) 145/73  Pulse: 88  Resp: 16  Temp: 98.3 F (36.8 C)  TempSrc: Oral  SpO2: 99%    General appearance: alert; no distress Extremities: no cyanosis or edema; symmetrical with no gross deformities; tenderness over his right Achilles tendon with no swelling/erythema and no bruising; full range of motion of R ankle CV: normal extremity capillary refill Skin: warm and dry Neurologic: normal gait; normal symmetric reflexes in all extremities; normal sensation in all extremities Psychological: alert and cooperative; normal mood and affect  No Known Allergies  Past Medical History:  Diagnosis Date  . ADHD (attention deficit hyperactivity disorder)   . Asthma   . Bipolar 1 disorder (HCC)   . Depression     Social History   Socioeconomic History  . Marital status: Single    Spouse name: Not on file  . Number of children: Not on file  . Years of education: Not on file  . Highest education level: Not on file  Social Needs  . Financial resource strain: Not on file  . Food insecurity - worry: Not on file  . Food insecurity - inability: Not on file  . Transportation needs - medical: Not on file  . Transportation needs - non-medical: Not on file  Occupational History  . Not on file  Tobacco Use  . Smoking status: Passive Smoke Exposure - Never Smoker  . Smokeless tobacco: Never Used  Substance and Sexual Activity  . Alcohol use: Yes  . Drug use: No  . Sexual activity: Not on file  Other Topics Concern  . Not on file  Social History Narrative  . Not on file      Mardella LaymanHagler, Shalece Staffa, MD 03/18/17 [phone]

## 2017-07-28 DIAGNOSIS — F3341 Major depressive disorder, recurrent, in partial remission: Secondary | ICD-10-CM | POA: Insufficient documentation

## 2017-07-28 DIAGNOSIS — J4540 Moderate persistent asthma, uncomplicated: Secondary | ICD-10-CM | POA: Insufficient documentation

## 2017-07-28 DIAGNOSIS — G8929 Other chronic pain: Secondary | ICD-10-CM | POA: Insufficient documentation

## 2017-07-28 DIAGNOSIS — M19071 Primary osteoarthritis, right ankle and foot: Secondary | ICD-10-CM | POA: Insufficient documentation

## 2017-07-28 DIAGNOSIS — F17200 Nicotine dependence, unspecified, uncomplicated: Secondary | ICD-10-CM | POA: Insufficient documentation

## 2017-07-28 DIAGNOSIS — J301 Allergic rhinitis due to pollen: Secondary | ICD-10-CM | POA: Insufficient documentation

## 2017-08-04 ENCOUNTER — Ambulatory Visit (HOSPITAL_COMMUNITY)
Admission: EM | Admit: 2017-08-04 | Discharge: 2017-08-04 | Disposition: A | Discharge status: Home / Self Care | Payer: PRIVATE HEALTH INSURANCE | Attending: Urgent Care | Admitting: Urgent Care

## 2017-08-04 ENCOUNTER — Encounter (HOSPITAL_COMMUNITY): Payer: Self-pay | Admitting: Emergency Medicine

## 2017-08-04 DIAGNOSIS — R0602 Shortness of breath: Secondary | ICD-10-CM | POA: Diagnosis not present

## 2017-08-04 DIAGNOSIS — R05 Cough: Secondary | ICD-10-CM

## 2017-08-04 DIAGNOSIS — J4520 Mild intermittent asthma, uncomplicated: Secondary | ICD-10-CM

## 2017-08-04 DIAGNOSIS — R059 Cough, unspecified: Secondary | ICD-10-CM

## 2017-08-04 DIAGNOSIS — Z760 Encounter for issue of repeat prescription: Secondary | ICD-10-CM

## 2017-08-04 DIAGNOSIS — Z720 Tobacco use: Secondary | ICD-10-CM | POA: Diagnosis not present

## 2017-08-04 DIAGNOSIS — R062 Wheezing: Secondary | ICD-10-CM

## 2017-08-04 MED ORDER — BENZONATATE 100 MG PO CAPS: 100.0000 mg | ORAL_CAPSULE | Freq: Three times a day (TID) | ORAL | 0 refills | Status: DC | PRN

## 2017-08-04 MED ORDER — AZITHROMYCIN 250 MG PO TABS: ORAL_TABLET | ORAL | 0 refills | Status: DC

## 2017-08-04 MED ORDER — PREDNISONE 20 MG PO TABS: ORAL_TABLET | ORAL | 0 refills | Status: DC

## 2017-08-04 MED ORDER — ALBUTEROL SULFATE HFA 108 (90 BASE) MCG/ACT IN AERS: 1.0000 | INHALATION_SPRAY | Freq: Four times a day (QID) | RESPIRATORY_TRACT | 1 refills | Status: DC | PRN

## 2017-08-04 NOTE — ED Provider Notes (Signed)
  MRN: [id number] DOB: [birth year]  Subjective:   Kaye Denardo is a 27 y.o. male presenting for 1 week history of dry cough that elicits chest pain, shob, wheezing.  He is using his albuterol inhaler but is almost out.  Has not tried other medications for relief.  Denies fever, n/v, belly pain, rashes, sinus congestion, sinus pain. Smokes 1/2ppd.    No current facility-administered medications for this encounter.   Current Outpatient Medications:  .  albuterol (PROVENTIL HFA;VENTOLIN HFA) 108 (90 Base) MCG/ACT inhaler, Inhale 1-2 puffs every 6 (six) hours as needed into the lungs for wheezing or shortness of breath., Disp: 1 Inhaler, Rfl: 0 .  diclofenac (VOLTAREN) 75 MG EC tablet, Take 1 tablet (75 mg total) by mouth 2 (two) times daily., Disp: 14 tablet, Rfl: 0   No Known Allergies  Past Medical History:  Diagnosis Date  . ADHD (attention deficit hyperactivity disorder)   . Asthma   . Bipolar 1 disorder (HCC)   . Depression    History reviewed. No pertinent surgical history.  Objective:   Vitals: BP 127/88   Pulse 84   Temp 98.5 F (36.9 C) (Oral)   Resp 16   Wt 220 lb (99.8 kg)   SpO2 98%   BMI 35.51 kg/m   Physical Exam  Constitutional: He is oriented to person, place, and time. He appears well-developed and well-nourished.  HENT:  Throat with significant postnasal drainage but no exudates.  Eyes: Right eye exhibits no discharge. Left eye exhibits no discharge. No scleral icterus.  Neck: Normal range of motion. Neck supple.  Cardiovascular: Normal rate, regular rhythm and intact distal pulses. Exam reveals no gallop and no friction rub.  No murmur heard. Pulmonary/Chest: No stridor. No respiratory distress. He has wheezes. He has no rales.  Lymphadenopathy:    He has no cervical adenopathy.  Neurological: He is alert and oriented to person, place, and time.  Skin: Skin is warm and dry.  Psychiatric: He has a normal mood and affect.    Assessment and Plan :    Cough  Wheezing  Shortness of breath  Mild intermittent asthma without complication  Tobacco use  We will start short steroid course, refilled his albuterol inhaler.  Patient is to hydrate very well and stop smoking at least until he gets better.  For cough suppression he will use Tessalon.  Provided patient with prescription for azithromycin in case he has no improvement over the next 3 to 4 days.  Return to clinic precautions discussed.   Wallis Bamberg, [place] 08/04/17 2155

## 2017-08-04 NOTE — Discharge Instructions (Signed)
Hydrate well with at least 2 liters (1 gallon) of water daily. For sore throat try using a honey-based tea. Use 3 teaspoons of honey with juice squeezed from half lemon. Place shaved pieces of ginger into 1/2-1 cup of water and warm over stove top. Then mix the ingredients and repeat every 4 hours as needed.  °

## 2017-08-04 NOTE — ED Triage Notes (Signed)
PT reports asthma has worsened this week. No longer has a nebulizer, inhaler is running out.

## 2018-12-02 IMAGING — DX DG CHEST 2V
2 series · 2 of 2 positions shown · non-contrast
Comparison: 09/02/2015

CLINICAL DATA: Asthma exacerbation

EXAM:
CHEST  2 VIEW

[chest pa]
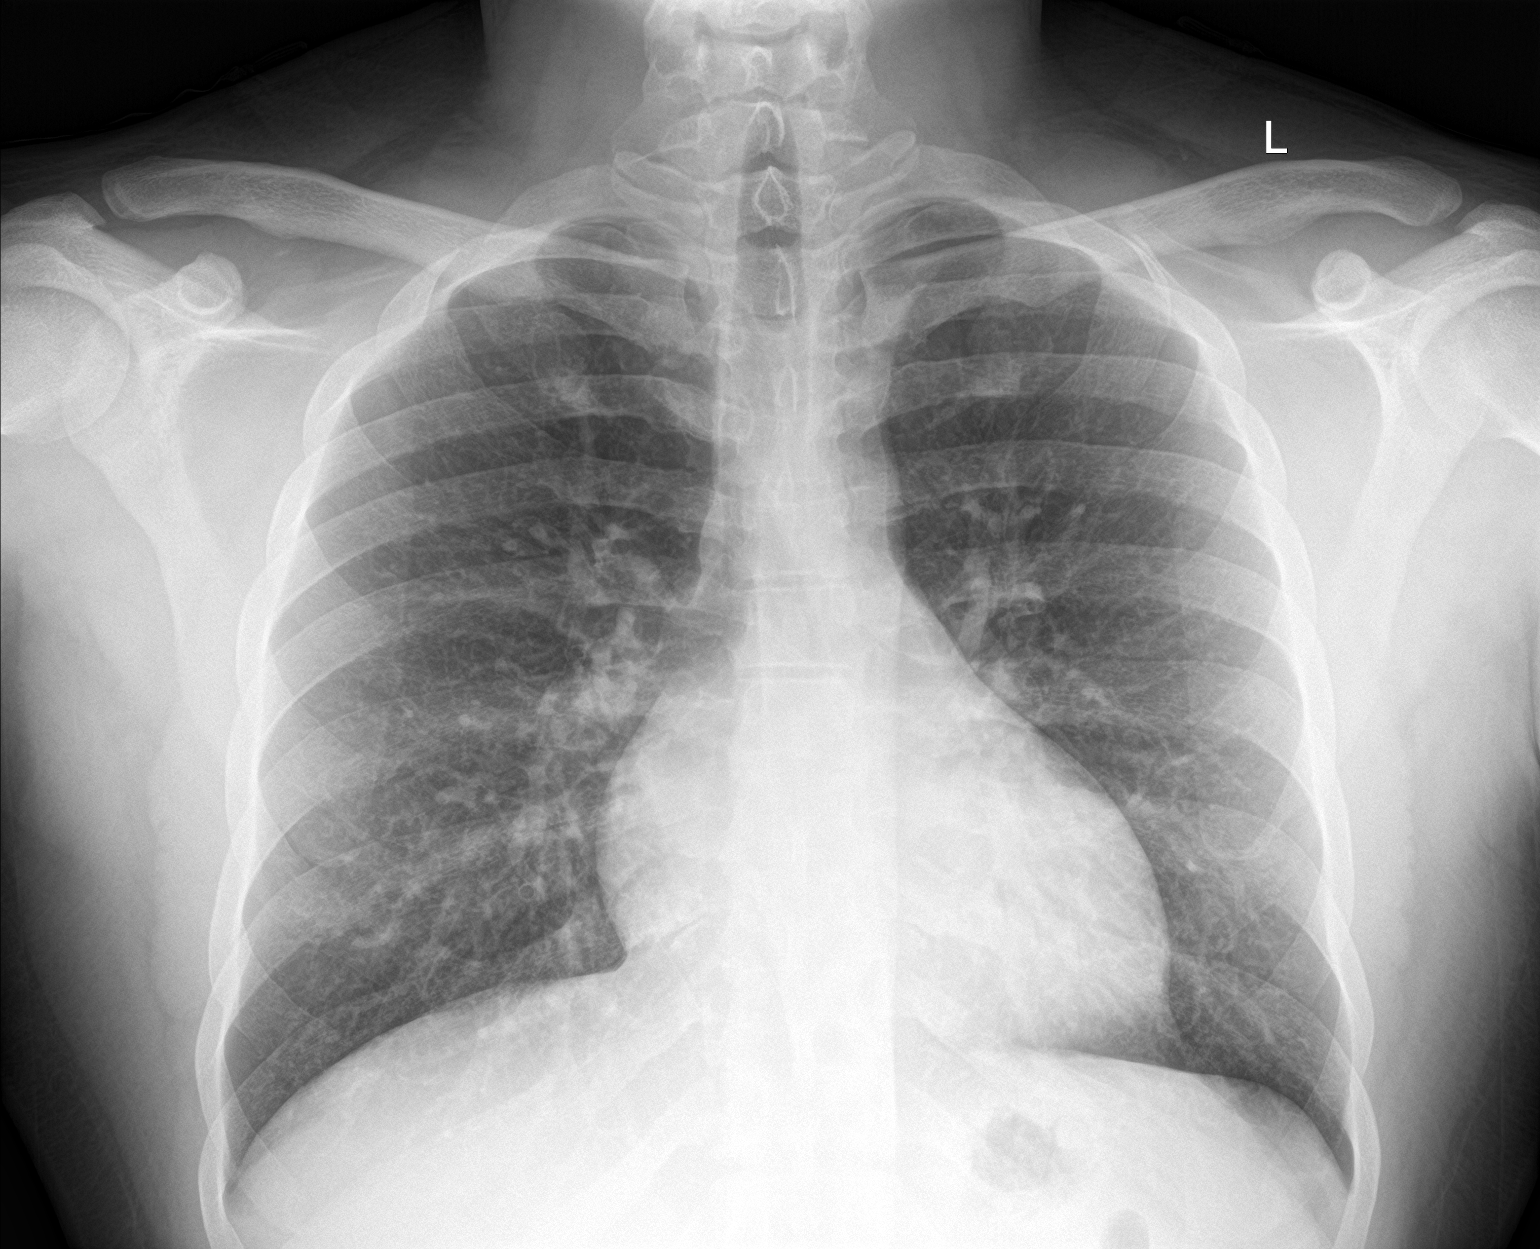

[chest lat]
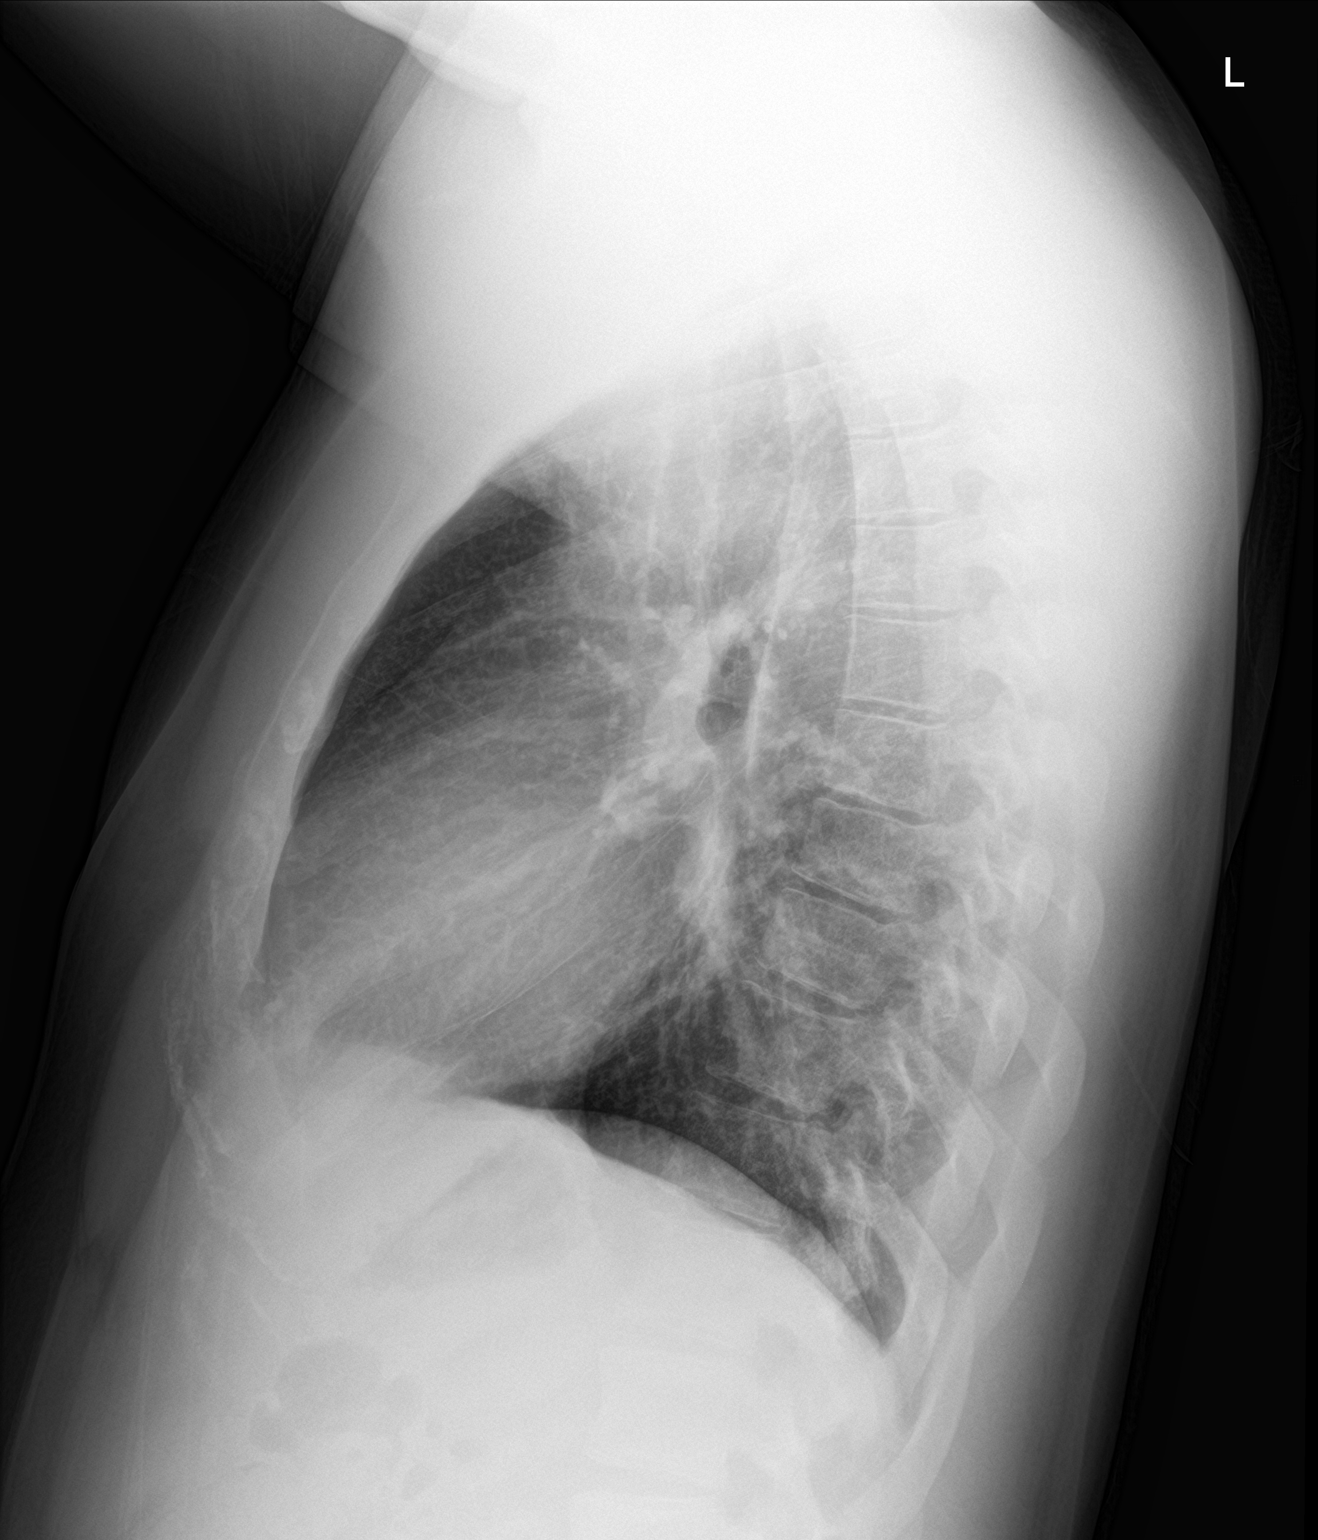

[2 of 2 positions shown; findings below may reference images not displayed]

FINDINGS: The heart size and mediastinal contours are within normal limits.
Both lungs are clear. The visualized skeletal structures are
unremarkable.
IMPRESSION: No active cardiopulmonary disease.

## 2018-12-03 ENCOUNTER — Ambulatory Visit (HOSPITAL_COMMUNITY)
Admission: EM | Admit: 2018-12-03 | Discharge: 2018-12-03 | Disposition: A | Discharge status: Home / Self Care | Payer: PRIVATE HEALTH INSURANCE

## 2018-12-03 NOTE — ED Notes (Signed)
Pt c/o "apendicitis problem". States "a while ago I had problems with my appendix and they said I might need surgery". Pt c/o lower badominal pain, concerned for appendix. Pt given info of our capabilities here, that we'd be able to see him but not able to do imagining. Pt decided to go to the ER.

## 2018-12-19 ENCOUNTER — Other Ambulatory Visit: Payer: Self-pay

## 2018-12-19 ENCOUNTER — Encounter (HOSPITAL_COMMUNITY): Payer: Self-pay

## 2018-12-19 ENCOUNTER — Ambulatory Visit (HOSPITAL_COMMUNITY)
Admission: EM | Admit: 2018-12-19 | Discharge: 2018-12-19 | Disposition: A | Discharge status: Home / Self Care | Payer: PRIVATE HEALTH INSURANCE | Attending: Family Medicine | Admitting: Family Medicine

## 2018-12-19 DIAGNOSIS — J45909 Unspecified asthma, uncomplicated: Secondary | ICD-10-CM | POA: Diagnosis not present

## 2018-12-19 DIAGNOSIS — K5900 Constipation, unspecified: Secondary | ICD-10-CM | POA: Diagnosis not present

## 2018-12-19 MED ORDER — ALBUTEROL SULFATE HFA 108 (90 BASE) MCG/ACT IN AERS: 2.0000 | INHALATION_SPRAY | Freq: Four times a day (QID) | RESPIRATORY_TRACT | 1 refills | Status: DC | PRN

## 2018-12-19 MED ORDER — POLYETHYLENE GLYCOL 3350 17 G PO PACK: 17.0000 g | PACK | Freq: Every day | ORAL | 0 refills | Status: DC

## 2018-12-19 NOTE — ED Provider Notes (Signed)
  [place]    CSN: [id number] Arrival date & time: 12/19/18  1612  Chief Complaint  Patient presents with  . Abdominal Pain    Subjective: Patient is a 28 y.o. male here for abdominal pain.  Over the past several days, the patient has been having generalized abdominal pain.  He was diagnosed with constipation in the emergency department.  He has increased his water intake.  He has not been taking anything to help with constipation.  He does not member when his last bowel movement was.  No bleeding or fevers.  Patient has a history of asthma.  He ran out of his albuterol inhaler and reports that he is wheezing.  Breathing is nonlabored and he has not been coughing.  No other upper respiratory symptoms.  He does not have a PCP.  ROS:  Constitutional: No fevers Lungs: + Wheezing  Past Medical History:  Diagnosis Date  . ADHD (attention deficit hyperactivity disorder)   . Asthma   . Bipolar 1 disorder ([place])   . Depression     Objective: BP 134/84 (BP Location: Right Arm)   Temp 98.6 F (37 C) (Oral)   Resp 18   SpO2 100%  General: Awake, appears stated age HEENT: MMM, EOMi Heart: RRR, no murmurs Lungs: Diffuse expiratory wheezes. No accessory muscle use Abd: BS+, S, ttp in lower quadrants, minimal distention, no masses or organomegaly Psych: Age appropriate judgment and insight, normal affect and mood   Final Clinical Impressions(s) / UC Diagnoses   Final diagnoses:  Uncomplicated asthma, unspecified asthma severity, unspecified whether persistent  Constipation, unspecified constipation type     Discharge Instructions     Try MiraLAX 1-2 times daily over the next 3-4 days. If no improvement, try using an enema. Stay well hydrated and keep lots of fiber in your diet.  Stay hydrated.   ED Prescriptions    Medication Sig Dispense Auth. Provider   albuterol (VENTOLIN HFA) 108 (90 Base) MCG/ACT inhaler Inhale 2 puffs into the lungs every 6 (six) hours  as needed for wheezing or shortness of breath. 18 g Shelda Pal, DO   polyethylene glycol (MIRALAX / GLYCOLAX) 17 g packet Take 17 g by mouth daily. [place]burn, Sanders, DO       Shelda Pal, [place] 12/19/18 1723

## 2018-12-19 NOTE — ED Triage Notes (Signed)
Pt states she has been having stomach pain. Pt states he needs a work note to return to work. Pt states he needs asthma inhaler.

## 2018-12-19 NOTE — Discharge Instructions (Signed)
Try MiraLAX 1-2 times daily over the next 3-4 days. If no improvement, try using an enema. Stay well hydrated and keep lots of fiber in your diet.  Stay hydrated.

## 2021-03-15 ENCOUNTER — Ambulatory Visit (HOSPITAL_COMMUNITY)
Admission: EM | Admit: 2021-03-15 | Discharge: 2021-03-15 | Disposition: A | Discharge status: Home / Self Care | Payer: PRIVATE HEALTH INSURANCE | Attending: Internal Medicine | Admitting: Internal Medicine

## 2021-03-15 ENCOUNTER — Encounter (HOSPITAL_COMMUNITY): Payer: Self-pay

## 2021-03-15 ENCOUNTER — Other Ambulatory Visit: Payer: Self-pay

## 2021-03-15 DIAGNOSIS — M7741 Metatarsalgia, right foot: Secondary | ICD-10-CM | POA: Diagnosis not present

## 2021-03-15 DIAGNOSIS — J4520 Mild intermittent asthma, uncomplicated: Secondary | ICD-10-CM

## 2021-03-15 MED ORDER — ALBUTEROL SULFATE HFA 108 (90 BASE) MCG/ACT IN AERS: 2.0000 | INHALATION_SPRAY | Freq: Four times a day (QID) | RESPIRATORY_TRACT | 1 refills | Status: DC | PRN

## 2021-03-15 MED ORDER — ALBUTEROL SULFATE (2.5 MG/3ML) 0.083% IN NEBU: 2.5000 mg | INHALATION_SOLUTION | Freq: Four times a day (QID) | RESPIRATORY_TRACT | 0 refills | Status: DC | PRN

## 2021-03-15 MED ORDER — NAPROXEN 375 MG PO TABS: 375.0000 mg | ORAL_TABLET | Freq: Two times a day (BID) | ORAL | 0 refills | Status: DC

## 2021-03-15 NOTE — ED Provider Notes (Addendum)
[facility]    CSN: [id number] Arrival date & time: 03/15/21  1546      History   Chief Complaint Chief Complaint  Patient presents with   Foot Pain    HPI Patrick Clements is a 30 y.o. male comes to the urgent care with right foot pain of 1 day duration.  Patient's symptoms started yesterday and has been persistent.  He describes the pain as sharp, constant, aggravated by movement with no known relieving factors.  Is associated with swelling of the right foot.  No significant erythema noted.  Patient has had similar episodes in the past which was treated with NSAIDs.  No injury or falls to the foot.  Patient has a history of asthma and is requesting medication refill.   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 No pertinent surgical history.     Home Medications    Prior to Admission medications   Medication Sig Start Date End Date Taking? Authorizing Provider  albuterol (PROVENTIL) (2.5 MG/3ML) 0.083% nebulizer solution Take 3 mLs (2.5 mg total) by nebulization every 6 (six) hours as needed for wheezing or shortness of breath. 03/15/21  Yes Adiya Selmer, Britta Mccreedy, MD  naproxen (NAPROSYN) 375 MG tablet Take 1 tablet (375 mg total) by mouth 2 (two) times daily. 03/15/21  Yes Tiffony Kite, Britta Mccreedy, MD  albuterol (VENTOLIN HFA) 108 (90 Base) MCG/ACT inhaler Inhale 2 puffs into the lungs every 6 (six) hours as needed for wheezing or shortness of breath. 03/15/21   Leea Rambeau, Britta Mccreedy, MD  polyethylene glycol (MIRALAX / GLYCOLAX) 17 g packet Take 17 g by mouth daily. 12/19/18   Sharlene Dory, DO    Family History History reviewed. No pertinent family history.  Social History Social History    Tobacco Use   Smoking status: Passive Smoke Exposure - Never Smoker   Smokeless tobacco: Never  Substance Use Topics   Alcohol use: Yes   Drug use: No     Allergies   Patient has no known allergies.   Review of Systems Review of Systems  Respiratory: Negative.    Gastrointestinal: Negative.   Musculoskeletal:  Positive for arthralgias and joint swelling. Negative for neck pain.  Neurological: Negative.     Physical Exam Triage Vital Signs ED Triage Vitals  Enc Vitals Group     BP 03/15/21 1713 (!) 144/91     Pulse Rate 03/15/21 1713 85     Resp 03/15/21 1713 18     Temp 03/15/21 1713 98.5 F (36.9 C)     Temp Source 03/15/21 1713 Oral     SpO2 03/15/21 1713 99 %     Weight --      Height --      Head Circumference --      Peak Flow --      Pain Score 03/15/21 1715 8     Pain Loc --      Pain Edu? --      Excl. in GC? --    No data found.  Updated Vital Signs BP (!) 144/91 (BP Location: Left Arm)    Pulse 85    Temp 98.5 F (36.9  C) (Oral)    Resp 18    SpO2 99%   Visual Acuity Right Eye Distance:   Left Eye Distance:   Bilateral Distance:    Right Eye Near:   Left Eye Near:    Bilateral Near:     Physical Exam Vitals and nursing note reviewed.  Constitutional:      General: He is not in acute distress.    Appearance: He is not ill-appearing.  Cardiovascular:     Rate and Rhythm: Normal rate and regular rhythm.  Musculoskeletal:        General: Swelling and tenderness present. No deformity or signs of injury. Normal range of motion.  Neurological:     Mental Status: He is alert.     UC Treatments / Results  Labs (all labs ordered are listed, but only abnormal results are displayed) Labs Reviewed - No data to display  EKG   Radiology No results found.  Procedures Procedures (including critical care time)  Medications Ordered in UC Medications - No data to display  Initial Impression / Assessment and Plan / UC Course  I have  reviewed the triage vital signs and the nursing notes.  Pertinent labs & imaging results that were available during my care of the patient were reviewed by me and considered in my medical decision making (see chart for details).     1.  Metatarsalgia of the right foot: Naproxen 375 mg twice daily Gentle range of motion exercises Heating pad use will help with swelling and pain No imaging indicated  3.  Mild intermittent asthma with no exacerbation: Albuterol inhaler is refilled. Final Clinical Impressions(s) / UC Diagnoses   Final diagnoses:  Metatarsalgia of right foot  Mild intermittent asthma in adult without complication     Discharge Instructions      Gentle range of motion exercises Take medications as prescribed Heating pad use will help with pain Appropriate foot support will help prevent recurrence of foot pain   ED Prescriptions     Medication Sig Dispense Auth. Provider   naproxen (NAPROSYN) 375 MG tablet Take 1 tablet (375 mg total) by mouth 2 (two) times daily. 20 tablet Merrit Waugh, Britta Mccreedy, MD   albuterol (VENTOLIN HFA) 108 (90 Base) MCG/ACT inhaler Inhale 2 puffs into the lungs every 6 (six) hours as needed for wheezing or shortness of breath. 18 g Merrilee Jansky, MD   albuterol (PROVENTIL) (2.5 MG/3ML) 0.083% nebulizer solution Take 3 mLs (2.5 mg total) by nebulization every 6 (six) hours as needed for wheezing or shortness of breath. 75 mL Trevor Wilkie, Britta Mccreedy, MD      PDMP not reviewed this encounter.   Merrilee Jansky, MD 03/15/21 3716    Merrilee Jansky, MD 03/19/21 [phone]

## 2021-03-15 NOTE — ED Triage Notes (Addendum)
Pt c/o rt ankle pain with swelling x2 days. Denies injury.   States is out of his asthma medication.

## 2021-03-15 NOTE — Discharge Instructions (Addendum)
Gentle range of motion exercises Take medications as prescribed Heating pad use will help with pain Appropriate foot support will help prevent recurrence of foot pain

## 2021-05-07 ENCOUNTER — Ambulatory Visit (HOSPITAL_COMMUNITY)
Admission: EM | Admit: 2021-05-07 | Discharge: 2021-05-07 | Disposition: A | Discharge status: Home / Self Care | Payer: Self-pay | Attending: Student | Admitting: Student

## 2021-05-07 ENCOUNTER — Other Ambulatory Visit: Payer: Self-pay

## 2021-05-07 ENCOUNTER — Encounter (HOSPITAL_COMMUNITY): Payer: Self-pay | Admitting: *Deleted

## 2021-05-07 DIAGNOSIS — M109 Gout, unspecified: Secondary | ICD-10-CM

## 2021-05-07 MED ORDER — METHYLPREDNISOLONE SODIUM SUCC 125 MG IJ SOLR
60.0000 mg | Freq: Once | INTRAMUSCULAR | Status: AC
  Administered 2021-05-07: 60 mg via INTRAMUSCULAR

## 2021-05-07 MED ORDER — PREDNISONE 20 MG PO TABS: 40.0000 mg | ORAL_TABLET | Freq: Every day | ORAL | 0 refills | Status: AC

## 2021-05-07 MED ORDER — METHYLPREDNISOLONE SODIUM SUCC 125 MG IJ SOLR
INTRAMUSCULAR | Status: AC
  Filled 2021-05-07: qty 2

## 2021-05-07 NOTE — ED Provider Notes (Signed)
[facility]    CSN: [id number] Arrival date & time: 05/07/21  1837      History   Chief Complaint Chief Complaint  Patient presents with   Foot Pain    LT    HPI Patrick Clements is a 31 y.o. male presenting with nontraumatic left great toe pain for 4 days.  Medical history noncontributory.  Describes of the left great MTP joint, with some swelling.  Denies trauma or overuse.  Some radiation of the pain into the midfoot, but this is tolerable.  Has not tried interventions at home.  Ambulating with pain. No prior history of gout but does endorse recent red meat.  HPI  Past Medical History:  Diagnosis Date   ADHD (attention deficit hyperactivity disorder)    Asthma    Bipolar 1 disorder [facility])    Depression     Patient Active Problem List   Diagnosis Date Noted   PNEUMONIA, RIGHT MIDDLE LOBE 11/13/2007   ASTHMA 11/13/2007   ACNE VULGARIS 11/13/2007    History reviewed. No pertinent surgical history.     Home Medications    Prior to Admission medications   Medication Sig Start Date End Date Taking? Authorizing Provider  predniSONE (DELTASONE) 20 MG tablet Take 2 tablets (40 mg total) by mouth daily for 3 days. Take with breakfast or lunch. Avoid NSAIDs (ibuprofen, etc) while taking this medication. 05/07/21 05/10/21 Yes Rhys Martini, PA-C  albuterol (PROVENTIL) (2.5 MG/3ML) 0.083% nebulizer solution Take 3 mLs (2.5 mg total) by nebulization every 6 (six) hours as needed for wheezing or shortness of breath. 03/15/21   Lamptey, Britta Mccreedy, MD  albuterol (VENTOLIN HFA) 108 (90 Base) MCG/ACT inhaler Inhale 2 puffs into the lungs every 6 (six) hours as needed for wheezing or shortness of breath. 03/15/21   Merrilee Jansky, MD  naproxen (NAPROSYN) 375 MG tablet Take 1 tablet (375 mg total) by mouth 2 (two) times daily. 03/15/21   Lamptey, Britta Mccreedy, MD  polyethylene glycol (MIRALAX / GLYCOLAX) 17 g packet Take 17 g by mouth daily. 12/19/18   Sharlene Dory,  DO    Family History History reviewed. No pertinent family history.  Social History Social History   Tobacco Use   Smoking status: Never    Passive exposure: Yes   Smokeless tobacco: Never  Substance Use Topics   Alcohol use: Yes   Drug use: No     Allergies   Patient has no known allergies.   Review of Systems Review of Systems  Musculoskeletal:        L great toe pain   All other systems reviewed and are negative.   Physical Exam Triage Vital Signs ED Triage Vitals  Enc Vitals Group     BP 05/07/21 1933 134/85     Pulse Rate 05/07/21 1933 92     Resp 05/07/21 1933 18     Temp 05/07/21 1933 98.8 F (37.1 C)     Temp src --      SpO2 05/07/21 1933 98 %     Weight --      Height --      Head Circumference --      Peak Flow --      Pain Score 05/07/21 1931 8     Pain Loc --      Pain Edu? --      Excl. in GC? --    No data found.  Updated Vital Signs BP 134/85  Pulse 92    Temp 98.8 F (37.1 C)    Resp 18    SpO2 98%   Visual Acuity Right Eye Distance:   Left Eye Distance:   Bilateral Distance:    Right Eye Near:   Left Eye Near:    Bilateral Near:     Physical Exam Vitals reviewed.  Constitutional:      General: He is not in acute distress.    Appearance: Normal appearance. He is not ill-appearing.  HENT:     Head: Normocephalic and atraumatic.  Pulmonary:     Effort: Pulmonary effort is normal.  Musculoskeletal:     Comments: L great toe: minimal swelling over the MTP joint. TTP. No midfoot or malleolar tenderness. DP 2+ cap refill <2 seconds   Neurological:     General: No focal deficit present.     Mental Status: He is alert and oriented to person, place, and time.  Psychiatric:        Mood and Affect: Mood normal.        Behavior: Behavior normal.        Thought Content: Thought content normal.        Judgment: Judgment normal.     UC Treatments / Results  Labs (all labs ordered are listed, but only abnormal results are  displayed) Labs Reviewed - No data to display  EKG   Radiology No results found.  Procedures Procedures (including critical care time)  Medications Ordered in UC Medications  methylPREDNISolone sodium succinate (SOLU-MEDROL) 125 mg/2 mL injection 60 mg (has no administration in time range)    Initial Impression / Assessment and Plan / UC Course  I have reviewed the triage vital signs and the nursing notes.  Pertinent labs & imaging results that were available during my care of the patient were reviewed by me and considered in my medical decision making (see chart for details).     This patient is a very pleasant 31 y.o. year old male presenting with gout L great toe. Neurovascularly intact.  X-ray imaging deferred given no trauma, patient is in agreement.  IM solumedrol administered during visit.  Short course of prednisone sent.  Low purine diet.  ED return precautions discussed. Patient verbalizes understanding and agreement.    Final Clinical Impressions(s) / UC Diagnoses   Final diagnoses:  Gouty arthritis of left great toe     Discharge Instructions      -Starting tomorrow: Prednisone, 2 pills taken at the same time for 3 days in a row.  Try taking this earlier in the day as it can give you energy. Limit NSAIDs like ibuprofen and alleve while taking this medication as they can increase your risk of stomach upset and even GI bleeding when in combination with a steroid. You can continue tylenol (acetaminophen) up to 1000mg  3x daily. -Rest, ice -Limit red meat, seafood, beer/alcohol    ED Prescriptions     Medication Sig Dispense Auth. Provider   predniSONE (DELTASONE) 20 MG tablet Take 2 tablets (40 mg total) by mouth daily for 3 days. Take with breakfast or lunch. Avoid NSAIDs (ibuprofen, etc) while taking this medication. 6 tablet , PA-C      PDMP not reviewed this encounter.   Rhys Martini, PA-C 05/07/21 1947

## 2021-05-07 NOTE — Discharge Instructions (Addendum)
-  Starting tomorrow: Prednisone, 2 pills taken at the same time for 3 days in a row.  Try taking this earlier in the day as it can give you energy. Limit NSAIDs like ibuprofen and alleve while taking this medication as they can increase your risk of stomach upset and even GI bleeding when in combination with a steroid. You can continue tylenol (acetaminophen) up to 1000mg  3x daily. -Rest, ice -Limit red meat, seafood, beer/alcohol

## 2021-05-07 NOTE — ED Triage Notes (Signed)
Pt reports Lt foot pain and swelling since Friday

## 2021-05-10 ENCOUNTER — Other Ambulatory Visit: Payer: Self-pay

## 2021-05-10 ENCOUNTER — Ambulatory Visit
Admission: EM | Admit: 2021-05-10 | Discharge: 2021-05-10 | Disposition: A | Discharge status: Home / Self Care | Payer: PRIVATE HEALTH INSURANCE | Attending: Physician Assistant | Admitting: Physician Assistant

## 2021-05-10 ENCOUNTER — Encounter: Payer: Self-pay | Admitting: Emergency Medicine

## 2021-05-10 DIAGNOSIS — J45901 Unspecified asthma with (acute) exacerbation: Secondary | ICD-10-CM | POA: Diagnosis not present

## 2021-05-10 MED ORDER — ALBUTEROL SULFATE HFA 108 (90 BASE) MCG/ACT IN AERS: 2.0000 | INHALATION_SPRAY | Freq: Four times a day (QID) | RESPIRATORY_TRACT | 1 refills | Status: DC | PRN

## 2021-05-10 MED ORDER — ALBUTEROL SULFATE (2.5 MG/3ML) 0.083% IN NEBU
2.5000 mg | INHALATION_SOLUTION | Freq: Once | RESPIRATORY_TRACT | Status: AC
  Administered 2021-05-10: 2.5 mg via RESPIRATORY_TRACT

## 2021-05-10 MED ORDER — ALBUTEROL SULFATE (2.5 MG/3ML) 0.083% IN NEBU: 2.5000 mg | INHALATION_SOLUTION | Freq: Four times a day (QID) | RESPIRATORY_TRACT | 0 refills | Status: DC | PRN

## 2021-05-10 MED ORDER — AZITHROMYCIN 250 MG PO TABS: 250.0000 mg | ORAL_TABLET | Freq: Every day | ORAL | 0 refills | Status: DC

## 2021-05-10 NOTE — ED Provider Notes (Signed)
[facility]    CSN: [id number] Arrival date & time: 05/10/21  1538      History   Chief Complaint Chief Complaint  Patient presents with   Shortness of Breath    HPI Patrick Clements is a 31 y.o. male.   Patient here today for evaluation of asthma attack started yesterday.  He does not have albuterol at home as he has run out of medication.  He would like nebulizer treatment in the office if possible as this is usually helpful.  He denies any fever.  He has not had any vomiting or diarrhea.  The history is provided by the patient.  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 No pertinent surgical history.     Home Medications    Prior to Admission medications   Medication Sig Start Date End Date Taking? Authorizing Provider  azithromycin (ZITHROMAX) 250 MG tablet Take 1 tablet (250 mg total) by mouth daily. Take first 2 tablets together, then 1 every day until finished. 05/10/21  Yes Tomi Bamberger, PA-C  albuterol (PROVENTIL) (2.5 MG/3ML) 0.083% nebulizer solution Take 3 mLs (2.5 mg total) by nebulization every 6 (six) hours as needed for wheezing or shortness of breath. 05/10/21   Tomi Bamberger, PA-C  albuterol (VENTOLIN HFA) 108 (90 Base) MCG/ACT inhaler Inhale 2 puffs into the lungs every 6 (six) hours as needed for wheezing or shortness of breath. 05/10/21   Tomi Bamberger, PA-C  naproxen (NAPROSYN) 375 MG tablet Take 1 tablet (375 mg total) by mouth 2 (two) times daily. 03/15/21   Lamptey, Britta Mccreedy, MD  polyethylene glycol  (MIRALAX / GLYCOLAX) 17 g packet Take 17 g by mouth daily. 12/19/18   Sharlene Dory, DO  predniSONE (DELTASONE) 20 MG tablet Take 2 tablets (40 mg total) by mouth daily for 3 days. Take with breakfast or lunch. Avoid NSAIDs (ibuprofen, etc) while taking this medication. 05/07/21 05/10/21  Rhys Martini, PA-C    Family History History reviewed. No pertinent family history.  Social History Social History   Tobacco Use   Smoking status: Never    Passive exposure: Yes   Smokeless tobacco: Never  Substance Use Topics   Alcohol use: Yes   Drug use: No     Allergies   Patient has no known allergies.   Review of Systems Review of Systems  Constitutional:  Negative for chills and fever.  HENT:  Positive for congestion. Negative for ear pain and sore throat.   Eyes:  Negative for discharge and redness.  Respiratory:  Positive for cough, shortness of breath and wheezing.   Gastrointestinal:  Negative for abdominal pain, diarrhea, nausea and vomiting.    Physical Exam Triage Vital Signs ED Triage Vitals [05/10/21 1552]  Enc Vitals Group     BP 134/87     Pulse Rate 98     Resp 18     Temp 98.5 F (36.9 C)     Temp Source Oral  SpO2 96 %     Weight      Height      Head Circumference      Peak Flow      Pain Score 0     Pain Loc      Pain Edu?      Excl. in GC?    No data found.  Updated Vital Signs BP 134/87 (BP Location: Right Arm)    Pulse 98    Temp 98.5 F (36.9 C) (Oral)    Resp 18    SpO2 96%      Physical Exam Vitals and nursing note reviewed.  Constitutional:      General: He is not in acute distress.    Appearance: Normal appearance. He is not ill-appearing.  HENT:     Head: Normocephalic and atraumatic.     Right Ear: Tympanic membrane normal.     Left Ear: Tympanic membrane normal.     Nose: Nose normal. No congestion.     Mouth/Throat:     Mouth: Mucous membranes are moist.     Pharynx: Oropharynx is clear. No oropharyngeal exudate  or posterior oropharyngeal erythema.  Eyes:     Conjunctiva/sclera: Conjunctivae normal.  Cardiovascular:     Rate and Rhythm: Normal rate and regular rhythm.     Heart sounds: Normal heart sounds. No murmur heard. Pulmonary:     Effort: Pulmonary effort is normal. No respiratory distress.     Breath sounds: Wheezing (rare scattered) present. No rhonchi or rales.  Skin:    General: Skin is warm and dry.  Neurological:     Mental Status: He is alert.  Psychiatric:        Mood and Affect: Mood normal.        Thought Content: Thought content normal.     UC Treatments / Results  Labs (all labs ordered are listed, but only abnormal results are displayed) Labs Reviewed - No data to display  EKG   Radiology No results found.  Procedures Procedures (including critical care time)  Medications Ordered in UC Medications  albuterol (PROVENTIL) (2.5 MG/3ML) 0.083% nebulizer solution 2.5 mg (2.5 mg Nebulization Given 05/10/21 1620)    Initial Impression / Assessment and Plan / UC Course  I have reviewed the triage vital signs and the nursing notes.  Pertinent labs & imaging results that were available during my care of the patient were reviewed by me and considered in my medical decision making (see chart for details).    Albuterol nebulizer administered in office with some relief.  Will refill albuterol for home use.  Recommended further evaluation in the emergency department if symptoms do not start to improve or if they worsen in any way.  Final Clinical Impressions(s) / UC Diagnoses   Final diagnoses:  Asthma with acute exacerbation, unspecified asthma severity, unspecified whether persistent   Discharge Instructions   None    ED Prescriptions     Medication Sig Dispense Auth. Provider   azithromycin (ZITHROMAX) 250 MG tablet Take 1 tablet (250 mg total) by mouth daily. Take first 2 tablets together, then 1 every day until finished. 6 tablet Erma Pinto F, PA-C    albuterol (PROVENTIL) (2.5 MG/3ML) 0.083% nebulizer solution Take 3 mLs (2.5 mg total) by nebulization every 6 (six) hours as needed for wheezing or shortness of breath. 75 mL Erma Pinto F, PA-C   albuterol (VENTOLIN HFA) 108 (90 Base) MCG/ACT inhaler Inhale 2 puffs into the lungs every 6 (six)  hours as needed for wheezing or shortness of breath. 18 g Tomi Bamberger, PA-C      PDMP not reviewed this encounter.   Tomi Bamberger, PA-C 05/10/21 1734

## 2021-05-10 NOTE — ED Notes (Signed)
Assisted patient with acquiring a primary care doctor and setting up a mychart account

## 2021-05-10 NOTE — ED Triage Notes (Signed)
Asthma attack started yesterday, out of albuterol, requesting neb

## 2021-06-25 ENCOUNTER — Emergency Department (HOSPITAL_COMMUNITY): Payer: BC Managed Care – PPO

## 2021-06-25 ENCOUNTER — Encounter: Payer: Self-pay | Admitting: Physician Assistant

## 2021-06-25 ENCOUNTER — Ambulatory Visit: Payer: PRIVATE HEALTH INSURANCE | Admitting: Internal Medicine

## 2021-06-25 ENCOUNTER — Other Ambulatory Visit: Payer: Self-pay

## 2021-06-25 ENCOUNTER — Emergency Department (HOSPITAL_COMMUNITY)
Admission: EM | Admit: 2021-06-25 | Discharge: 2021-06-25 | Disposition: A | Discharge status: Home / Self Care | Payer: BC Managed Care – PPO | Attending: Emergency Medicine | Admitting: Emergency Medicine

## 2021-06-25 ENCOUNTER — Encounter (HOSPITAL_COMMUNITY): Payer: Self-pay

## 2021-06-25 ENCOUNTER — Ambulatory Visit (INDEPENDENT_AMBULATORY_CARE_PROVIDER_SITE_OTHER)
Admission: EM | Admit: 2021-06-25 | Discharge: 2021-06-25 | Disposition: A | Discharge status: Other Acute Inpatient Hospital | Payer: BC Managed Care – PPO | Source: Home / Self Care

## 2021-06-25 DIAGNOSIS — R079 Chest pain, unspecified: Secondary | ICD-10-CM

## 2021-06-25 DIAGNOSIS — R0789 Other chest pain: Secondary | ICD-10-CM | POA: Insufficient documentation

## 2021-06-25 DIAGNOSIS — J45909 Unspecified asthma, uncomplicated: Secondary | ICD-10-CM | POA: Diagnosis not present

## 2021-06-25 DIAGNOSIS — R0602 Shortness of breath: Secondary | ICD-10-CM | POA: Insufficient documentation

## 2021-06-25 LAB — CBC
HCT: 40.6 % (ref 39.0–52.0)
Hemoglobin: 12.9 g/dL — ABNORMAL LOW (ref 13.0–17.0)
MCH: 27.2 pg (ref 26.0–34.0)
MCHC: 31.8 g/dL (ref 30.0–36.0)
MCV: 85.7 fL (ref 80.0–100.0)
Platelets: 264 10*3/uL (ref 150–400)
RBC: 4.74 MIL/uL (ref 4.22–5.81)
RDW: 12.8 % (ref 11.5–15.5)
WBC: 9.1 10*3/uL (ref 4.0–10.5)
nRBC: 0 % (ref 0.0–0.2)

## 2021-06-25 LAB — HEPATIC FUNCTION PANEL
ALT: 28 U/L (ref 0–44)
AST: 20 U/L (ref 15–41)
Albumin: 4 g/dL (ref 3.5–5.0)
Alkaline Phosphatase: 69 U/L (ref 38–126)
Bilirubin, Direct: <0.1 mg/dL (ref 0.0–0.2)
Total Bilirubin: 1 mg/dL (ref 0.3–1.2)
Total Protein: 7.7 g/dL (ref 6.5–8.1)

## 2021-06-25 LAB — BASIC METABOLIC PANEL
Anion gap: 8 (ref 5–15)
BUN: 6 mg/dL (ref 6–20)
CO2: 23 mmol/L (ref 22–32)
Calcium: 9.4 mg/dL (ref 8.9–10.3)
Chloride: 105 mmol/L (ref 98–111)
Creatinine, Ser: 1.08 mg/dL (ref 0.61–1.24)
GFR, Estimated: >60 mL/min (ref >60)
Glucose, Bld: 160 mg/dL — ABNORMAL HIGH (ref 70–99)
Potassium: 4.2 mmol/L (ref 3.5–5.1)
Sodium: 136 mmol/L (ref 135–145)

## 2021-06-25 LAB — TROPONIN I (HIGH SENSITIVITY)
Troponin I (High Sensitivity): 8 ng/L (ref <18)
Troponin I (High Sensitivity): 5 ng/L (ref <18)

## 2021-06-25 LAB — BRAIN NATRIURETIC PEPTIDE: B Natriuretic Peptide: 14.4 pg/mL (ref 0.0–100.0)

## 2021-06-25 LAB — LIPASE, BLOOD: Lipase: 27 U/L (ref 11–51)

## 2021-06-25 MED ORDER — LIDOCAINE VISCOUS HCL 2 % MT SOLN
15.0000 mL | Freq: Once | OROMUCOSAL | Status: AC
  Administered 2021-06-25: 15 mL via ORAL
  Filled 2021-06-25: qty 15

## 2021-06-25 MED ORDER — NITROGLYCERIN 2 % TD OINT
1.0000 [in_us] | TOPICAL_OINTMENT | Freq: Once | TRANSDERMAL | Status: AC
  Administered 2021-06-25: 1 [in_us] via TOPICAL
  Filled 2021-06-25: qty 1

## 2021-06-25 MED ORDER — ASPIRIN 81 MG PO CHEW: 324.0000 mg | CHEWABLE_TABLET | Freq: Once | ORAL | Status: DC

## 2021-06-25 MED ORDER — ALBUTEROL SULFATE (2.5 MG/3ML) 0.083% IN NEBU
2.5000 mg | INHALATION_SOLUTION | Freq: Once | RESPIRATORY_TRACT | Status: AC
Start: 2021-06-25 — End: 2021-06-25
  Administered 2021-06-25: 2.5 mg via RESPIRATORY_TRACT
  Filled 2021-06-25: qty 3

## 2021-06-25 MED ORDER — MORPHINE SULFATE (PF) 4 MG/ML IV SOLN
4.0000 mg | Freq: Once | INTRAVENOUS | Status: AC
  Administered 2021-06-25: 4 mg via INTRAVENOUS
  Filled 2021-06-25: qty 1

## 2021-06-25 MED ORDER — ALUM & MAG HYDROXIDE-SIMETH 200-200-20 MG/5ML PO SUSP
30.0000 mL | Freq: Once | ORAL | Status: AC
Start: 2021-06-25 — End: 2021-06-25
  Administered 2021-06-25: 30 mL via ORAL
  Filled 2021-06-25: qty 30

## 2021-06-25 MED ORDER — ALBUTEROL SULFATE (2.5 MG/3ML) 0.083% IN NEBU
2.5000 mg | INHALATION_SOLUTION | Freq: Once | RESPIRATORY_TRACT | Status: AC
  Administered 2021-06-25: 2.5 mg via RESPIRATORY_TRACT
  Filled 2021-06-25: qty 3

## 2021-06-25 MED ORDER — IOHEXOL 350 MG/ML SOLN
100.0000 mL | Freq: Once | INTRAVENOUS | Status: AC | PRN
  Administered 2021-06-25: 100 mL via INTRAVENOUS

## 2021-06-25 MED ORDER — FAMOTIDINE IN NACL 20-0.9 MG/50ML-% IV SOLN
20.0000 mg | Freq: Once | INTRAVENOUS | Status: AC
  Administered 2021-06-25: 20 mg via INTRAVENOUS
  Filled 2021-06-25: qty 50

## 2021-06-25 MED ORDER — FAMOTIDINE 20 MG PO TABS: 20.0000 mg | ORAL_TABLET | Freq: Two times a day (BID) | ORAL | 0 refills | Status: DC

## 2021-06-25 MED ORDER — SODIUM CHLORIDE 0.9 % IV SOLN: INTRAVENOUS | Status: DC

## 2021-06-25 MED ORDER — ASPIRIN 81 MG PO CHEW
324.0000 mg | CHEWABLE_TABLET | Freq: Once | ORAL | Status: AC
  Administered 2021-06-25: 324 mg via ORAL
  Filled 2021-06-25: qty 4

## 2021-06-25 NOTE — Discharge Instructions (Addendum)
Continue asthma treatment at home as needed.  There was a prescription sent for an acid blocking medication to help with stomach inflammation and reflux.  This is likely to improve your symptoms.  Continue to take this over the next 30 days.  If you feel like you might benefit from a longer course of this medicine, talk to your primary care doctor.  If you develop any worsening symptoms, please return to the emergency department. ?

## 2021-06-25 NOTE — ED Notes (Signed)
Pa sent pt to er via pov  ? ?

## 2021-06-25 NOTE — ED Notes (Signed)
Patient verbalizes understanding of discharge instructions. Opportunity for questioning and answers were provided. Armband removed by staff, pt discharged from ED.  

## 2021-06-25 NOTE — ED Provider Notes (Signed)
?[facility] ?Provider Note ? ? ?CSN: [id number] ?Arrival date & time: 06/25/21  1122 ? ?  ? ?History ? ?No chief complaint on file. ? ? ?Patrick Clements is a 31 y.o. male. ? ?HPI ?Patient presents for chest pain and shortness of breath.  Medical history includes asthma, ADHD, bipolar disorder, depression.  He was initially seen at urgent care and was sent to the emergency department due to concerns on EKG. he states that the symptoms have been ongoing for the past 4 days.  He does feel that they came on suddenly 4 days ago.  This was after he had gotten home from work.  Patient has not been seen prior to today.  He was hoping that the symptoms would resolve on their own.  He does not work over the weekends.  He does do physical labor for work. ?HPI: A 31 year old patient with a history of obesity presents for evaluation of chest pain. Initial onset of pain was less than one hour ago. The patient's chest pain is described as heaviness/pressure/tightness and is not worse with exertion. The patient's chest pain is middle- or left-sided, is not well-localized, is not sharp and does not radiate to the arms/jaw/neck. The patient does not complain of nausea and denies diaphoresis. The patient has smoked in the past 90 days. The patient has no history of stroke, has no history of peripheral artery disease, denies any history of treated diabetes, has no relevant family history of coronary artery disease (first degree relative at less than age 31), is not hypertensive and has no history of hypercholesterolemia.  ? ?Home Medications ?Prior to Admission medications   ?Medication Sig Start Date End Date Taking? Authorizing Provider  ?albuterol (PROVENTIL) (2.5 MG/3ML) 0.083% nebulizer solution Take 3 mLs (2.5 mg total) by nebulization every 6 (six) hours as needed for wheezing or shortness of breath. ?Patient taking differently: Take 2.5 mg by nebulization at bedtime as needed for wheezing  or shortness of breath. 05/10/21  Yes Tomi BambergerMyers, Rebecca F, PA-C  ?albuterol (VENTOLIN HFA) 108 (90 Base) MCG/ACT inhaler Inhale 2 puffs into the lungs every 6 (six) hours as needed for wheezing or shortness of breath. ?Patient taking differently: Inhale 2-3 puffs into the lungs 3 (three) times daily as needed for wheezing or shortness of breath. 05/10/21  Yes Tomi BambergerMyers, Rebecca F, PA-C  ?famotidine (PEPCID) 20 MG tablet Take 1 tablet (20 mg total) by mouth 2 (two) times daily. 06/25/21 07/25/21 Yes Gloris Manchesterixon, Aviraj Kentner, MD  ?ibuprofen (ADVIL) 200 MG tablet Take 400 mg by mouth 2 (two) times daily as needed for mild pain.   Yes [provider]  ?azithromycin (ZITHROMAX) 250 MG tablet Take 1 tablet (250 mg total) by mouth daily. Take first 2 tablets together, then 1 every day until finished. ?Patient not taking: Reported on 06/25/2021 05/10/21   Tomi BambergerMyers, Rebecca F, PA-C  ?naproxen (NAPROSYN) 375 MG tablet Take 1 tablet (375 mg total) by mouth 2 (two) times daily. ?Patient not taking: Reported on 06/25/2021 03/15/21   Merrilee JanskyLamptey, Philip O, MD  ?   ? ?Allergies    ?Patient has no known allergies.   ? ?Review of Systems   ?Review of Systems  ?Respiratory:  Positive for shortness of breath.   ?Cardiovascular:  Positive for chest pain.  ?All other systems reviewed and are negative. ? ?Physical Exam ?Updated Vital Signs ?BP (!) 146/76   Pulse 87   Temp 100 ?F (37.8 ?C)   Resp 17  SpO2 96%  ?Physical Exam ?Vitals and nursing note reviewed.  ?Constitutional:   ?   General: He is not in acute distress. ?   Appearance: Normal appearance. He is well-developed. He is not ill-appearing, toxic-appearing or diaphoretic.  ?HENT:  ?   Head: Normocephalic and atraumatic.  ?   Right Ear: External ear normal.  ?   Left Ear: External ear normal.  ?   Nose: Nose normal.  ?   Mouth/Throat:  ?   Mouth: Mucous membranes are moist.  ?   Pharynx: Oropharynx is clear.  ?Eyes:  ?   Extraocular Movements: Extraocular movements intact.  ?   Conjunctiva/sclera:  Conjunctivae normal.  ?Cardiovascular:  ?   Rate and Rhythm: Normal rate and regular rhythm.  ?   Heart sounds: No murmur heard. ?Pulmonary:  ?   Effort: Pulmonary effort is normal. No respiratory distress.  ?   Breath sounds: Wheezing (End expiratory) present. No rales.  ?Chest:  ?   Chest wall: Tenderness present.  ?Abdominal:  ?   Palpations: Abdomen is soft.  ?   Tenderness: There is no abdominal tenderness.  ?Musculoskeletal:     ?   General: No swelling.  ?   Cervical back: Normal range of motion and neck supple. No rigidity.  ?   Left lower leg: No edema.  ?Skin: ?   General: Skin is warm and dry.  ?   Capillary Refill: Capillary refill takes less than 2 seconds.  ?   Coloration: Skin is not jaundiced or pale.  ?Neurological:  ?   General: No focal deficit present.  ?   Mental Status: He is alert and oriented to person, place, and time.  ?   Cranial Nerves: No cranial nerve deficit.  ?   Sensory: No sensory deficit.  ?   Motor: No weakness.  ?   Coordination: Coordination normal.  ?Psychiatric:     ?   Mood and Affect: Mood normal.     ?   Behavior: Behavior normal.     ?   Thought Content: Thought content normal.     ?   Judgment: Judgment normal.  ? ? ?ED Results / Procedures / Treatments   ?Labs ?(all labs ordered are listed, but only abnormal results are displayed) ?Labs Reviewed  ?BASIC METABOLIC PANEL - Abnormal; Notable for the following components:  ?    Result Value  ? Glucose, Bld 160 (*)   ? All other components within normal limits  ?CBC - Abnormal; Notable for the following components:  ? Hemoglobin 12.9 (*)   ? All other components within normal limits  ?LIPASE, BLOOD  ?HEPATIC FUNCTION PANEL  ?BRAIN NATRIURETIC PEPTIDE  ?TROPONIN I (HIGH SENSITIVITY)  ?TROPONIN I (HIGH SENSITIVITY)  ? ? ?EKG ?EKG Interpretation ? ?Date/Time:  Monday June 25 2021 13:50:15 EDT ?Ventricular Rate:  90 ?PR Interval:  150 ?QRS Duration: 96 ?QT Interval:  335 ?QTC Calculation: 410 ?R Axis:   104 ?Text  Interpretation: Sinus rhythm Nonspecific T wave abnormality Confirmed by Gloris Manchester [phone]) on 06/25/2021 3:50:59 PM ? ?Radiology ?DG Chest 2 View ? ?Result Date: 06/25/2021 ?CLINICAL DATA:  31 year old male with chest pain and shortness of breath. EXAM: CHEST - 2 VIEW COMPARISON:  Chest radiographs 06/17/2016 and earlier. FINDINGS: Low lung volumes today, crowding of lung markings at both bases. Extension weighted cardiac size. Mediastinal contours remain within normal limits. Visualized tracheal air column is within normal limits. No pneumothorax, pulmonary edema, pleural effusion or consolidation.  But there is patchy asymmetric opacity at the left lung base/lingula. No acute osseous abnormality identified. Negative visible bowel gas. IMPRESSION: Low lung volumes. Nonspecific patchy increased opacity at the left lung base. No pleural effusion. Electronically Signed   By: Odessa Fleming M.D.   On: 06/25/2021 12:02  ? ?CT Angio Chest PE W and/or Wo Contrast ? ?Result Date: 06/25/2021 ?CLINICAL DATA:  Chest pain and shortness of breath since Friday EXAM: CT ANGIOGRAPHY CHEST WITH CONTRAST TECHNIQUE: Multidetector CT imaging of the chest was performed using the standard protocol during bolus administration of intravenous contrast. Multiplanar CT image reconstructions and MIPs were obtained to evaluate the vascular anatomy. RADIATION DOSE REDUCTION: This exam was performed according to the departmental dose-optimization program which includes automated exposure control, adjustment of the mA and/or kV according to patient size and/or use of iterative reconstruction technique. CONTRAST:  OMNIPAQUE IOHEXOL 350 MG/ML SOLN COMPARISON:  06/25/2021 chest x-ray FINDINGS: Cardiovascular: Pulmonary arteries are normal in caliber and appear patent. No significant filling defect or acute pulmonary embolus by CTA. Intact thoracic aorta. Patent 3 vessel arch anatomy. No aneurysm or dissection. No mediastinal hemorrhage or hematoma.  Normal heart size. No pericardial effusion. Central venous structures are patent. No veno-occlusive process. No anomalous pulmonary venous return. Mediastinum/Nodes: No enlarged mediastinal, hilar, or axillary lymph nodes. T

## 2021-06-25 NOTE — ED Triage Notes (Signed)
Pt presents with cp sob and right ankle pain. Pt reports tendonitis and arthritis in that ankle so he thinks it is flailing  up.  ?Pt reports not taking any pain medication  ?

## 2021-06-25 NOTE — ED Provider Notes (Signed)
Patient here today for evaluation of chest pain. He reports he has had pain the last 3 days that seems to be worsening rather than improving. Pain is substernal and worse with activity. He notes he had difficulty sleeping last night due to discomfort. He does have associated shortness of breath. Recommended further evaluation in the ED given some changes on EKG and symptoms-- needs cardiac workup. Patient is stable in office and feels he can transport himself to ED.  ?  ?Tomi Bamberger, PA-C ?06/25/21 1057 ? ?

## 2021-06-25 NOTE — ED Provider Triage Note (Signed)
Emergency Medicine Provider Triage Evaluation Note ? ?Patrick Clements , a 31 y.o. male  was evaluated in triage.  Pt complains of chest pain and shortness of breath since Friday.  Says it is not going away.  History of asthma.  No other medical conditions.  Did a breathing treatment last night.  Went to urgent care and they reported that he had EKG changes and needed a cardiac work-up ? ?Review of Systems  ?Positive: Chest pain or shortness of breath ?Negative: Dizziness ? ?Physical Exam  ?BP (!) 140/97   Pulse (!) 103   Temp 100 ?F (37.8 ?C)   Resp 18   SpO2 96%  ?Gen:   Awake, no distress   ?Resp:  Normal effort, wheezing in all lung fields ?MSK:   Moves extremities without difficulty  ?Other:   ? ?Medical Decision Making  ?Medically screening exam initiated at 11:45 AM.  Appropriate orders placed.  Patrick Clements was informed that the remainder of the evaluation will be completed by another provider, this initial triage assessment does not replace that evaluation, and the importance of remaining in the ED until their evaluation is complete. ? ? ?  ?Saddie Benders, PA-C ?06/25/21 1147 ? ?

## 2021-06-25 NOTE — ED Triage Notes (Signed)
Patient sent from [facility] for evaluation of chest pain and SOB since Friday. Pain worse with movement and inspiration ?

## 2021-06-25 NOTE — ED Notes (Signed)
Patient transported to CT 

## 2021-07-11 ENCOUNTER — Ambulatory Visit: Payer: PRIVATE HEALTH INSURANCE | Admitting: Family Medicine

## 2021-08-09 ENCOUNTER — Other Ambulatory Visit: Payer: Self-pay | Admitting: Emergency Medicine

## 2021-09-07 ENCOUNTER — Ambulatory Visit (INDEPENDENT_AMBULATORY_CARE_PROVIDER_SITE_OTHER): Payer: BC Managed Care – PPO

## 2021-09-07 ENCOUNTER — Ambulatory Visit
Admission: EM | Admit: 2021-09-07 | Discharge: 2021-09-07 | Disposition: A | Discharge status: Home / Self Care | Payer: BC Managed Care – PPO | Attending: Urgent Care | Admitting: Urgent Care

## 2021-09-07 DIAGNOSIS — M2142 Flat foot [pes planus] (acquired), left foot: Secondary | ICD-10-CM

## 2021-09-07 DIAGNOSIS — B070 Plantar wart: Secondary | ICD-10-CM | POA: Diagnosis not present

## 2021-09-07 DIAGNOSIS — M2141 Flat foot [pes planus] (acquired), right foot: Secondary | ICD-10-CM

## 2021-09-07 DIAGNOSIS — M7732 Calcaneal spur, left foot: Secondary | ICD-10-CM

## 2021-09-07 NOTE — ED Triage Notes (Signed)
 Patient presents to Urgent Care with complaints of L heel/ankle pain since last week. Patient reports 3/10 pain.

## 2021-09-07 NOTE — ED Provider Notes (Signed)
[facility]    CSN: [id number] Arrival date & time: 09/07/21  1529      History   Chief Complaint Chief Complaint  Patient presents with   Ankle Pain    HPI Patrick Clements is a 31 y.o. male.   Pleasant 31 year old male with a known history of feet issues presents today with a 1 week history of left foot pain.  He states there are 2 areas to his left foot that are hurting.  There is a pinpoint spot to the plantar aspect between his third and fourth toe on the plantar surface.  He additionally reports pain to the plantar surface of his Achilles.  He admits to flatfeet.  He has not tried any treatments.  He works on his feet and states that rocking all days causing discomfort.  He denies any injury, swelling, bruising, or decreased range of motion.  He has seen podiatry in the past before his right foot.   Ankle Pain   Past Medical History:  Diagnosis Date   ADHD (attention deficit hyperactivity disorder)    Asthma    Bipolar 1 disorder [facility])    Depression     Patient Active Problem List   Diagnosis Date Noted   PNEUMONIA, RIGHT MIDDLE LOBE 11/13/2007   ASTHMA 11/13/2007   ACNE VULGARIS 11/13/2007    No past surgical history on file.     Home Medications    Prior to Admission medications   Medication Sig Start Date End Date Taking? Authorizing Provider  albuterol (PROVENTIL) (2.5 MG/3ML) 0.083% nebulizer solution Take 3 mLs (2.5 mg total) by nebulization every 6 (six) hours as needed for wheezing or shortness of breath. Patient taking differently: Take 2.5 mg by nebulization at bedtime as needed for wheezing or shortness of breath. 05/10/21   Tomi Bamberger, PA-C  albuterol (VENTOLIN HFA) 108 (90 Base) MCG/ACT inhaler Inhale 2 puffs into the lungs every 6 (six) hours as needed for wheezing or shortness of breath. Patient taking differently: Inhale 2-3 puffs into the lungs 3 (three) times daily as needed for wheezing or shortness of breath. 05/10/21    Tomi Bamberger, PA-C  famotidine (PEPCID) 20 MG tablet Take 1 tablet (20 mg total) by mouth 2 (two) times daily. 06/25/21 07/25/21  Gloris Manchester, MD  ibuprofen (ADVIL) 200 MG tablet Take 400 mg by mouth 2 (two) times daily as needed for mild pain.    [provider]    Family History No family history on file.  Social History Social History   Tobacco Use   Smoking status: Never    Passive exposure: Yes   Smokeless tobacco: Never  Substance Use Topics   Alcohol use: Yes   Drug use: No     Allergies   Patient has no known allergies.   Review of Systems Review of Systems  Musculoskeletal:  Positive for arthralgias.     Physical Exam Triage Vital Signs ED Triage Vitals  Enc Vitals Group     BP 09/07/21 1546 (!) 163/114     Pulse Rate 09/07/21 1546 74     Resp 09/07/21 1546 18     Temp 09/07/21 1546 98 F (36.7 C)     Temp src --      SpO2 09/07/21 1546 98 %     Weight --      Height --      Head Circumference --      Peak Flow --  Pain Score 09/07/21 1545 3     Pain Loc --      Pain Edu? --      Excl. in GC? --    No data found.  Updated Vital Signs BP (!) 163/114   Pulse 74   Temp 98 F (36.7 C)   Resp 18   SpO2 98%   Visual Acuity Right Eye Distance:   Left Eye Distance:   Bilateral Distance:    Right Eye Near:   Left Eye Near:    Bilateral Near:     Physical Exam Vitals and nursing note reviewed.  Constitutional:      General: He is not in acute distress.    Appearance: Normal appearance. He is obese. He is not ill-appearing, toxic-appearing or diaphoretic.  Musculoskeletal:     Right lower leg: Normal. No swelling, tenderness or bony tenderness. No edema.     Left lower leg: Normal. No swelling, tenderness or bony tenderness. No edema.     Right ankle: Normal. No swelling, deformity, ecchymosis or lacerations. No tenderness. Normal range of motion. Anterior drawer test negative. Normal pulse.     Right Achilles Tendon: Normal.  No tenderness or defects. Thompson's test negative.     Left ankle: Normal. No swelling, deformity, ecchymosis or lacerations. No tenderness. Normal range of motion. Anterior drawer test negative. Normal pulse.     Left Achilles Tendon: Normal. No tenderness or defects. Thompson's test negative.     Left foot: Normal range of motion and normal capillary refill. Deformity (severe pes planus) and bony tenderness (plantar surface near calcaneous) present. No swelling, foot drop, prominent metatarsal heads or crepitus. Normal pulse.  Feet:     Left foot:     Skin integrity: Ulcer (plantar wart between 3rd/4th digit on plantar surface foot L) present. No callus.  Neurological:     Mental Status: He is alert.      UC Treatments / Results  Labs (all labs ordered are listed, but only abnormal results are displayed) Labs Reviewed - No data to display  EKG   Radiology DG Foot Complete Left  Result Date: 09/07/2021 CLINICAL DATA:  Left plantar pain. Worse to calcaneus. Concern for calcaneal spur. EXAM: LEFT FOOT - COMPLETE 3+ VIEW COMPARISON:  None Available. FINDINGS: Normal bone mineralization. Joint spaces are preserved. No acute fracture is seen. Minimal/early plantar calcaneal heel spur. No dislocation. IMPRESSION: Minimal early plantar calcaneal heel spur. Electronically Signed   By: Neita Garnet M.D.   On: 09/07/2021 16:36    Procedures Procedures (including critical care time)  Medications Ordered in UC Medications - No data to display  Initial Impression / Assessment and Plan / UC Course  I have reviewed the triage vital signs and the nursing notes.  Pertinent labs & imaging results that were available during my care of the patient were reviewed by me and considered in my medical decision making (see chart for details).     Calcaneal spur - mild, however noted at area of maximal pain.  Question if this is developing due to extra strain on his plantar fascia from his extremely  severe pes planus.  Supportive measures discussed.  NSAIDs contraindicated due to elevated blood pressure Plantar wart left foot -appears to be extremely deep, would recommend over-the-counter medication for now, follow-up with podiatry for possible excision Pes planus -recommend follow-up with podiatry for possible custom orthotics.  Final Clinical Impressions(s) / UC Diagnoses   Final diagnoses:  Calcaneal spur, left  Plantar  wart of left foot  Pes planus of both feet     Discharge Instructions      The pain in the front of your foot is due to a plantar wart.  Please purchase over-the-counter plantar wart remover.  Given its size and depth, this likely will be need to be surgically excised by podiatry. The pain in the back of your foot appears to be a developing calcaneal spur.  These can develop due to excessive strain on your plantar fascia due to flatfeet. Please purchase an over-the-counter arch support for your shoes, and follow-up with podiatry for a possible orthotic. Due to your blood pressure being extremely elevated, over-the-counter anti-inflammatories are not recommended as it can further raise your blood pressure.  Please establish care with a PCP or follow-up with 1 if you have 1 already for further evaluation.    ED Prescriptions   None    PDMP not reviewed this encounter.   Maretta Bees, [place] 09/07/21 2159

## 2021-09-10 ENCOUNTER — Ambulatory Visit: Payer: PRIVATE HEALTH INSURANCE | Admitting: Family

## 2021-09-19 ENCOUNTER — Ambulatory Visit (INDEPENDENT_AMBULATORY_CARE_PROVIDER_SITE_OTHER): Payer: PRIVATE HEALTH INSURANCE | Admitting: Family

## 2021-09-19 ENCOUNTER — Encounter: Payer: Self-pay | Admitting: Family

## 2021-09-19 VITALS — BP 142/100 | HR 84 | Temp 97.6°F | Ht 66.0 in | Wt 276.2 lb

## 2021-09-19 DIAGNOSIS — F17200 Nicotine dependence, unspecified, uncomplicated: Secondary | ICD-10-CM

## 2021-09-19 DIAGNOSIS — Z0001 Encounter for general adult medical examination with abnormal findings: Secondary | ICD-10-CM

## 2021-09-19 DIAGNOSIS — I10 Essential (primary) hypertension: Secondary | ICD-10-CM

## 2021-09-19 DIAGNOSIS — Z0000 Encounter for general adult medical examination without abnormal findings: Secondary | ICD-10-CM

## 2021-09-19 LAB — COMPREHENSIVE METABOLIC PANEL
ALT: 24 U/L (ref 0–53)
AST: 19 U/L (ref 0–37)
Albumin: 4.6 g/dL (ref 3.5–5.2)
Alkaline Phosphatase: 107 U/L (ref 39–117)
BUN: 13 mg/dL (ref 6–23)
CO2: 26 mEq/L (ref 19–32)
Calcium: 10 mg/dL (ref 8.4–10.5)
Chloride: 104 mEq/L (ref 96–112)
Creatinine, Ser: 1.14 mg/dL (ref 0.40–1.50)
GFR: 86.12 mL/min (ref >60.00)
Glucose, Bld: 107 mg/dL — ABNORMAL HIGH (ref 70–99)
Potassium: 4.7 mEq/L (ref 3.5–5.1)
Sodium: 139 mEq/L (ref 135–145)
Total Bilirubin: 0.5 mg/dL (ref 0.2–1.2)
Total Protein: 7.6 g/dL (ref 6.0–8.3)

## 2021-09-19 LAB — LIPID PANEL
Cholesterol: 201 mg/dL — ABNORMAL HIGH (ref 0–200)
HDL: 46 mg/dL (ref >39.00)
LDL Cholesterol: 119 mg/dL — ABNORMAL HIGH (ref 0–99)
NonHDL: 154.5
Total CHOL/HDL Ratio: 4
Triglycerides: 178 mg/dL — ABNORMAL HIGH (ref 0.0–149.0)
VLDL: 35.6 mg/dL (ref 0.0–40.0)

## 2021-09-19 LAB — CBC WITH DIFFERENTIAL/PLATELET
Basophils Absolute: 0 10*3/uL (ref 0.0–0.1)
Basophils Relative: 0.6 % (ref 0.0–3.0)
Eosinophils Absolute: 0.4 10*3/uL (ref 0.0–0.7)
Eosinophils Relative: 4.7 % (ref 0.0–5.0)
HCT: 41.9 % (ref 39.0–52.0)
Hemoglobin: 13.6 g/dL (ref 13.0–17.0)
Lymphocytes Relative: 40.4 % (ref 12.0–46.0)
Lymphs Abs: 3.2 10*3/uL (ref 0.7–4.0)
MCHC: 32.4 g/dL (ref 30.0–36.0)
MCV: 83.7 fl (ref 78.0–100.0)
Monocytes Absolute: 0.7 10*3/uL (ref 0.1–1.0)
Monocytes Relative: 8.4 % (ref 3.0–12.0)
Neutro Abs: 3.6 10*3/uL (ref 1.4–7.7)
Neutrophils Relative %: 45.9 % (ref 43.0–77.0)
Platelets: 231 10*3/uL (ref 150.0–400.0)
RBC: 5 Mil/uL (ref 4.22–5.81)
RDW: 14.1 % (ref 11.5–15.5)
WBC: 7.9 10*3/uL (ref 4.0–10.5)

## 2021-09-19 LAB — TSH: TSH: 3.39 u[IU]/mL (ref 0.35–5.50)

## 2021-09-19 MED ORDER — AMLODIPINE BESYLATE 5 MG PO TABS: 5.0000 mg | ORAL_TABLET | Freq: Every day | ORAL | 2 refills | Status: DC

## 2021-09-19 NOTE — Progress Notes (Signed)
Phone: [phone]   Subjective:  Patient 31 y.o. male presenting for annual physical.  Chief Complaint  Patient presents with   Establish Care   Annual Exam    Non-fasting W/ Labs, drank juice,  No concerns   HPI: Hypertension: Patient is currently maintained on the following medications for blood pressure: None - new DX Patient reports good compliance with blood pressure medications. Patient denies chest pain, headaches, shortness of breath or swelling. Last 3 blood pressure readings in our office are as follows: BP Readings from Last 3 Encounters:  09/19/21 (!) 142/100  09/07/21 (!) 163/114  06/25/21 (!) 146/76   See problem oriented charting- ROS- full  review of systems was completed and negative  except for: HTN noted in HPI above.  The following were reviewed and entered/updated in epic: Past Medical History:  Diagnosis Date   ADHD (attention deficit hyperactivity disorder)    Asthma    ASTHMA 11/13/2007   Qualifier: Diagnosis of  By: Hassell Done FNP, [place]     Bipolar 1 disorder [facility])    Depression    PNEUMONIA, RIGHT MIDDLE LOBE 11/13/2007   Qualifier: Diagnosis of  By: Hassell Done FNP, Tori Milks     Patient Active Problem List   Diagnosis Date Noted   Primary hypertension 09/19/2021   Morbid obesity ([place]) 09/19/2021   Arthritis of right ankle 07/28/2017   Chronic pain of right ankle 07/28/2017   Moderate persistent asthma 07/28/2017   Recurrent major depressive disorder, in partial remission ([place]) 07/28/2017   Seasonal allergic rhinitis due to pollen 07/28/2017   Vaping nicotine dependence, non-tobacco product 07/28/2017   ACNE VULGARIS 11/13/2007   History reviewed. No pertinent surgical history.  History reviewed. No pertinent family history.  Medications- reviewed and updated Current Outpatient Medications  Medication Sig Dispense Refill   albuterol (PROVENTIL) (2.5 MG/3ML) 0.083% nebulizer solution Take 3 mLs (2.5 mg total) by nebulization every 6 (six)  hours as needed for wheezing or shortness of breath. (Patient taking differently: Take 2.5 mg by nebulization at bedtime as needed for wheezing or shortness of breath.) 75 mL 0   albuterol (VENTOLIN HFA) 108 (90 Base) MCG/ACT inhaler Inhale 2 puffs into the lungs every 6 (six) hours as needed for wheezing or shortness of breath. (Patient taking differently: Inhale 2-3 puffs into the lungs 3 (three) times daily as needed for wheezing or shortness of breath.) 18 g 1   amLODipine (NORVASC) 5 MG tablet Take 1 tablet (5 mg total) by mouth daily. 30 tablet 2   ibuprofen (ADVIL) 200 MG tablet Take 400 mg by mouth 2 (two) times daily as needed for mild pain.     No current facility-administered medications for this visit.    Allergies-reviewed and updated No Known Allergies  Social History   Social History Narrative   Not on file   Objective  Objective:  BP (!) 142/100 (BP Location: Left Arm, Patient Position: Sitting, Cuff Size: Large)   Pulse 84   Temp 97.6 F (36.4 C) (Temporal)   Ht 5' 6"  (1.676 m)   Wt 276 lb 4 oz (125.3 kg)   SpO2 96%   BMI 44.59 kg/m  Physical Exam Vitals and nursing note reviewed.  Constitutional:      General: He is not in acute distress.    Appearance: Normal appearance.  HENT:     Head: Normocephalic.     Right Ear: Tympanic membrane and external ear normal.     Left Ear: Tympanic membrane and external ear normal.  Nose: Nose normal.     Mouth/Throat:     Mouth: Mucous membranes are moist.  Eyes:     Extraocular Movements: Extraocular movements intact.  Cardiovascular:     Rate and Rhythm: Normal rate and regular rhythm.  Pulmonary:     Effort: Pulmonary effort is normal.     Breath sounds: Normal breath sounds.  Abdominal:     General: Abdomen is flat. There is no distension.     Palpations: Abdomen is soft.     Tenderness: There is no abdominal tenderness.  Musculoskeletal:        General: Normal range of motion.     Cervical back: Normal  range of motion.  Skin:    General: Skin is warm and dry.  Neurological:     Mental Status: He is alert and oriented to person, place, and time.  Psychiatric:        Mood and Affect: Mood normal.        Behavior: Behavior normal.        Judgment: Judgment normal.    Assessment and Plan   Health Maintenance counseling: 1. Anticipatory guidance: Patient counseled regarding regular dental exams q6 months, eye exams yearly, avoiding smoking and second hand smoke, limiting alcohol to 2 beverages per day.   2. Risk factor reduction:  Advised patient of need for regular exercise and diet rich in fruits and vegetables to reduce risk of heart attack and stroke.    Wt Readings from Last 3 Encounters:  09/19/21 276 lb 4 oz (125.3 kg)  08/04/17 220 lb (99.8 kg)  10/11/15 257 lb (116.6 kg)   3. Immunizations/screenings/ancillary studies Immunization History  Administered Date(s) Administered   DTP 06/15/1991, 10/05/1991, 05/09/1992, 10/11/1993, 09/29/1995   Hepatitis B 10/05/1991, 11/08/1993, 12/12/2003   HiB (PRP-OMP) 06/15/1991, 10/05/1991, 05/09/1992, 11/08/1993   MMR 05/09/1992   OPV 06/15/1991, 10/05/1991, 05/09/1992, 09/29/1995   Health Maintenance Due  Topic Date Due   HIV Screening  Never done   Hepatitis C Screening  Never done   TETANUS/TDAP  Never done    4. Skin cancer screening- advised regular sunscreen use. Denies worrisome, changing, or new skin lesions.  5. Smoking associated screening: non- smoker, but vapes several times/day w/nicotine 6. STD screening - N/A 7. Alcohol screening: denies   Problem List Items Addressed This Visit       Cardiovascular and Mediastinum   Primary hypertension - Primary    Chronic - never treated, no previous primary care, elevated readings in UC and ER. Asymptomatic. Pt reports weight gain over last 6 mos. Starting Alodipine, advised on use & SE. Also advised on low sodium diet and drinking at least 2L of water daily, reports he drinks  a lot of juice, not as much sodas as he used to, HTN handout provided.  f/u in 1 month.      Relevant Medications   amLODipine (NORVASC) 5 MG tablet     Other   Vaping nicotine dependence, non-tobacco product    Chronic- advised on increased risk of lung infections, nicotine increases heart disease. advised on complete cessation.      Morbid obesity (HCC)    Chronic - was about 30-40lbs lighter last year. Advised on reducing sugary drinks, sweets in his diet. Wt. Loss strategies reviewed including portion control, less carbs including sweets, eating most of calories earlier in day, drinking 64oz water qd, and establishing daily exercise routine.      Other Visit Diagnoses     Encounter for  general adult medical examination with abnormal findings       Relevant Orders   Comprehensive metabolic panel   TSH   Lipid panel   CBC with Differential/Platelet      Recommended follow up: Return in about 4 weeks (around 10/17/2021) for HTN. Future Appointments  Date Time Provider [place]  10/17/2021 10:40 AM Jeanie Sewer, NP [facility]    Lab/Order associations: non- fasting   Jeanie Sewer, NP

## 2021-09-19 NOTE — Assessment & Plan Note (Signed)
Chronic- advised on increased risk of lung infections, nicotine increases heart disease. advised on complete cessation.

## 2021-09-19 NOTE — Assessment & Plan Note (Addendum)
Chronic - never treated, no previous primary care, elevated readings in UC and ER. Asymptomatic. Pt reports weight gain over last 6 mos. Starting Alodipine, advised on use & SE. Also advised on low sodium diet and drinking at least 2L of water daily, reports he drinks a lot of juice, not as much sodas as he used to, HTN handout provided.  f/u in 1 month.

## 2021-09-19 NOTE — Patient Instructions (Addendum)
Welcome to [facility], It was a pleasure meeting you today!   Go to the lab for blood work today.  As discussed, I have sent medication for your blood pressure to your pharmacy.  Please schedule a 1 month follow up visit today.      PLEASE NOTE: If you had any LAB tests please let us know if you have not heard back within a few days. You may see your results on MyChart before we have a chance to review them but we will give you a call once they are reviewed by [place]. If we ordered any REFERRALS today, please let us know if you have not heard from their office within the next week.  Let us know through MyChart if you are needing REFILLS, or have your pharmacy send [place] the request. You can also use MyChart to communicate with me or any office staff.

## 2021-09-19 NOTE — Assessment & Plan Note (Signed)
Chronic - was about 30-40lbs lighter last year. Advised on reducing sugary drinks, sweets in his diet. Wt. Loss strategies reviewed including portion control, less carbs including sweets, eating most of calories earlier in day, drinking 64oz water qd, and establishing daily exercise routine.

## 2021-09-25 NOTE — Progress Notes (Signed)
Hi Patrick Clements,  Your glucose and triglycerides are elevated but this is due to not fasting, so not too concerning. Your electrolytes, blood count, thyroid, liver & kidney function are all normal.  Your total cholesterol number & LDL (bad #) are high. Need to reduce any fried foods, alcohol, nonnutritional snacks e.g. chips/cookies,pies, cakes and candies, fatty meat (red meat), high fat dairy foods:  including cheese, milk, ice cream.  Increase fruits/vegetables/fiber.   Continue or restart an exercise routine, shooting for 5-7days per week.   Recommend repeating fasting lipids (no food or drink except black coffee or water after midnight) in 6 months with an office visit, can schedule today.  Any questions let me know, take care!

## 2021-10-17 ENCOUNTER — Ambulatory Visit: Payer: PRIVATE HEALTH INSURANCE | Admitting: Family

## 2021-10-18 ENCOUNTER — Ambulatory Visit: Payer: BC Managed Care – PPO | Admitting: Family

## 2021-12-10 ENCOUNTER — Encounter: Payer: Self-pay | Admitting: *Deleted

## 2022-02-28 ENCOUNTER — Encounter: Payer: Self-pay | Admitting: *Deleted

## 2022-07-11 ENCOUNTER — Ambulatory Visit
Admission: EM | Admit: 2022-07-11 | Discharge: 2022-07-11 | Disposition: A | Discharge status: Home / Self Care | Payer: Self-pay | Attending: Internal Medicine | Admitting: Internal Medicine

## 2022-07-11 ENCOUNTER — Ambulatory Visit: Payer: BC Managed Care – PPO

## 2022-07-11 DIAGNOSIS — M25572 Pain in left ankle and joints of left foot: Secondary | ICD-10-CM | POA: Insufficient documentation

## 2022-07-11 DIAGNOSIS — G8929 Other chronic pain: Secondary | ICD-10-CM | POA: Insufficient documentation

## 2022-07-11 DIAGNOSIS — J069 Acute upper respiratory infection, unspecified: Secondary | ICD-10-CM | POA: Insufficient documentation

## 2022-07-11 DIAGNOSIS — F17210 Nicotine dependence, cigarettes, uncomplicated: Secondary | ICD-10-CM | POA: Insufficient documentation

## 2022-07-11 DIAGNOSIS — M79672 Pain in left foot: Secondary | ICD-10-CM

## 2022-07-11 DIAGNOSIS — Z1152 Encounter for screening for COVID-19: Secondary | ICD-10-CM | POA: Insufficient documentation

## 2022-07-11 DIAGNOSIS — J4521 Mild intermittent asthma with (acute) exacerbation: Secondary | ICD-10-CM | POA: Insufficient documentation

## 2022-07-11 DIAGNOSIS — M7732 Calcaneal spur, left foot: Secondary | ICD-10-CM | POA: Insufficient documentation

## 2022-07-11 MED ORDER — ALBUTEROL SULFATE (2.5 MG/3ML) 0.083% IN NEBU
2.5000 mg | INHALATION_SOLUTION | Freq: Once | RESPIRATORY_TRACT | Status: AC
  Administered 2022-07-11: 2.5 mg via RESPIRATORY_TRACT

## 2022-07-11 MED ORDER — ALBUTEROL SULFATE HFA 108 (90 BASE) MCG/ACT IN AERS: 1.0000 | INHALATION_SPRAY | Freq: Four times a day (QID) | RESPIRATORY_TRACT | 0 refills | Status: DC | PRN

## 2022-07-11 MED ORDER — PREDNISONE 20 MG PO TABS: 40.0000 mg | ORAL_TABLET | Freq: Every day | ORAL | 0 refills | Status: AC

## 2022-07-11 MED ORDER — BENZONATATE 100 MG PO CAPS: 100.0000 mg | ORAL_CAPSULE | Freq: Three times a day (TID) | ORAL | 0 refills | Status: DC | PRN

## 2022-07-11 MED ORDER — ALBUTEROL SULFATE (2.5 MG/3ML) 0.083% IN NEBU: 2.5000 mg | INHALATION_SOLUTION | Freq: Four times a day (QID) | RESPIRATORY_TRACT | 0 refills | Status: DC | PRN

## 2022-07-11 NOTE — Discharge Instructions (Addendum)
Suspect that you have a viral illness causing your asthma to flareup.  I have refilled your albuterol inhaler and prescribed prednisone steroid and a cough medication to alleviate this.  Follow-up if any symptoms persist or worsen.  Your x-ray did not show any obvious abnormalities.  Will do uric acid blood work to check for possible gout.  Prednisone should be helpful with your pain as well.  Elevate extremity and apply ice.  Follow-up with orthopedist if symptoms persist or worsen.

## 2022-07-11 NOTE — ED Triage Notes (Addendum)
Pt presents to uc with co of left ankle pain since this morning. Pt reports swelling and pain. Worse when walking or putting pressure on it, at work nurse gave him motrin and other medication for pain.  . Pt reports cold like symptoms also started this week.

## 2022-07-11 NOTE — ED Provider Notes (Signed)
[facility]    CSN: [id number] Arrival date & time: 07/11/22  1714      History   Chief Complaint Chief Complaint  Patient presents with   Ankle Pain   URI    HPI Patrick Clements is a 32 y.o. male.   Patient presents with 2 different chief complaints today.  Patient reports left ankle and foot pain that started this morning.  He denies any obvious injury to the area.  Reports that he has chronic ankle pain intermittently but is not sure why. Reports this feels different than his chronic ankle pain. He does have history of gout but has never had gout in that area.  Has taken Motrin given by his nurse at work with minimal improvement.  Reports bearing weight exacerbates pain.  Patient also reports sneezing and coughing that started about 2 to 3 days ago.  Reports he has asthma and has had some shortness of breath where he feels like he needs to use his albuterol inhaler.  He states that he does not have an inhaler available.  Denies any fever or known sick contacts.   Ankle Pain URI   Past Medical History:  Diagnosis Date   ADHD (attention deficit hyperactivity disorder)    Asthma    ASTHMA 11/13/2007   Qualifier: Diagnosis of  By: Daphine Deutscher FNP, Nykedtra     Bipolar 1 disorder [facility])    Depression    PNEUMONIA, RIGHT MIDDLE LOBE 11/13/2007   Qualifier: Diagnosis of  By: Daphine Deutscher FNP, Zena Amos      Patient Active Problem List   Diagnosis Date Noted   Primary hypertension 09/19/2021   Morbid obesity (HCC) 09/19/2021   Arthritis of right ankle 07/28/2017   Chronic pain of right ankle 07/28/2017   Moderate persistent asthma 07/28/2017   Recurrent major depressive disorder, in partial remission (HCC) 07/28/2017   Seasonal allergic rhinitis due to pollen 07/28/2017   Vaping nicotine dependence, non-tobacco product 07/28/2017   ACNE VULGARIS 11/13/2007    History reviewed. No pertinent surgical history.     Home Medications    Prior to Admission medications    Medication Sig Start Date End Date Taking? Authorizing Provider  albuterol (PROVENTIL) (2.5 MG/3ML) 0.083% nebulizer solution Take 3 mLs (2.5 mg total) by nebulization every 6 (six) hours as needed for wheezing or shortness of breath. 07/11/22  Yes Fynn Vanblarcom, Acie Fredrickson, FNP  albuterol (VENTOLIN HFA) 108 (90 Base) MCG/ACT inhaler Inhale 1-2 puffs into the lungs every 6 (six) hours as needed for wheezing or shortness of breath. 07/11/22  Yes Ebb Carelock, [place] E, FNP  benzonatate (TESSALON) 100 MG capsule Take 1 capsule (100 mg total) by mouth every 8 (eight) hours as needed for cough. 07/11/22  Yes Leul Narramore, Rolly Salter E, FNP  predniSONE (DELTASONE) 20 MG tablet Take 2 tablets (40 mg total) by mouth daily for 5 days. 07/11/22 07/16/22 Yes Reza Crymes, Acie Fredrickson, FNP  albuterol (PROVENTIL) (2.5 MG/3ML) 0.083% nebulizer solution Take 3 mLs (2.5 mg total) by nebulization every 6 (six) hours as needed for wheezing or shortness of breath. Patient taking differently: Take 2.5 mg by nebulization at bedtime as needed for wheezing or shortness of breath. 05/10/21   Tomi Bamberger, PA-C  albuterol (VENTOLIN HFA) 108 (90 Base) MCG/ACT inhaler Inhale 2 puffs into the lungs every 6 (six) hours as needed for wheezing or shortness of breath. Patient taking differently: Inhale 2-3 puffs into the lungs 3 (three) times daily as needed for wheezing or shortness of breath.  05/10/21   Tomi Bamberger, PA-C  amLODipine (NORVASC) 5 MG tablet Take 1 tablet (5 mg total) by mouth daily. 09/19/21   Dulce Sellar, NP  ibuprofen (ADVIL) 200 MG tablet Take 400 mg by mouth 2 (two) times daily as needed for mild pain.    [provider]    Family History History reviewed. No pertinent family history.  Social History Social History   Tobacco Use   Smoking status: Some Days    Types: Cigarettes    Passive exposure: Yes   Smokeless tobacco: Never  Vaping Use   Vaping Use: Some days  Substance Use Topics   Alcohol use: Never   Drug use: No      Allergies   Patient has no known allergies.   Review of Systems Review of Systems Per HPI  Physical Exam Triage Vital Signs ED Triage Vitals  Enc Vitals Group     BP 07/11/22 1755 137/85     Pulse Rate 07/11/22 1755 81     Resp 07/11/22 1755 20     Temp 07/11/22 1755 (!) 97.3 F (36.3 C)     Temp Source 07/11/22 1755 Oral     SpO2 07/11/22 1755 97 %     Weight --      Height --      Head Circumference --      Peak Flow --      Pain Score 07/11/22 1802 7     Pain Loc --      Pain Edu? --      Excl. in GC? --    No data found.  Updated Vital Signs BP 137/85 (BP Location: Left Arm)   Pulse 81   Temp (!) 97.3 F (36.3 C) (Oral)   Resp 20   SpO2 97%   Visual Acuity Right Eye Distance:   Left Eye Distance:   Bilateral Distance:    Right Eye Near:   Left Eye Near:    Bilateral Near:     Physical Exam Constitutional:      General: He is not in acute distress.    Appearance: Normal appearance. He is not toxic-appearing or diaphoretic.  HENT:     Head: Normocephalic and atraumatic.     Right Ear: Tympanic membrane and ear canal normal.     Left Ear: Tympanic membrane and ear canal normal.     Nose: Congestion present.     Mouth/Throat:     Mouth: Mucous membranes are moist.     Pharynx: No posterior oropharyngeal erythema.  Eyes:     Extraocular Movements: Extraocular movements intact.     Conjunctiva/sclera: Conjunctivae normal.     Pupils: Pupils are equal, round, and reactive to light.  Cardiovascular:     Rate and Rhythm: Normal rate and regular rhythm.     Pulses: Normal pulses.     Heart sounds: Normal heart sounds.  Pulmonary:     Effort: Pulmonary effort is normal. No respiratory distress.     Breath sounds: No stridor. Wheezing present. No rhonchi or rales.     Comments: Audible wheezing noted and wheezing to auscultation. Abdominal:     General: Abdomen is flat. Bowel sounds are normal.     Palpations: Abdomen is soft.   Musculoskeletal:        General: Normal range of motion.     Cervical back: Normal range of motion.     Comments: Patient has tenderness to palpation with associated swelling present to anterior  lateral left ankle that extends into the dorsal surface of foot to the midfoot.  No discoloration noted.  Patient can wiggle toes.  Neurovascularly intact.  Skin:    General: Skin is warm and dry.  Neurological:     General: No focal deficit present.     Mental Status: He is alert and oriented to person, place, and time. Mental status is at baseline.  Psychiatric:        Mood and Affect: Mood normal.        Behavior: Behavior normal.      UC Treatments / Results  Labs (all labs ordered are listed, but only abnormal results are displayed) Labs Reviewed  SARS CORONAVIRUS 2 (TAT 6-24 HRS)  URIC ACID    EKG   Radiology DG Foot Complete Left  Result Date: 07/11/2022 CLINICAL DATA:  Ankle and foot pain. EXAM: LEFT ANKLE COMPLETE - 3+ VIEW; LEFT FOOT - COMPLETE 3+ VIEW COMPARISON:  Left foot radiographs 09/07/2021 FINDINGS: Left ankle: Normal bone mineralization. The ankle mortise is symmetric and intact. Possible mild anterior and lateral soft tissue swelling. Joint spaces are preserved. No acute fracture or dislocation. Left foot: Normal bone mineralization. Joint spaces are preserved. No acute fracture is seen. No dislocation. Unchanged minimal/early plantar calcaneal heel spur. IMPRESSION: 1. No acute fracture. 2. Unchanged minimal/early plantar calcaneal heel spur. Electronically Signed   By: Neita Garnet M.D.   On: 07/11/2022 18:50   DG Ankle Complete Left  Result Date: 07/11/2022 CLINICAL DATA:  Ankle and foot pain. EXAM: LEFT ANKLE COMPLETE - 3+ VIEW; LEFT FOOT - COMPLETE 3+ VIEW COMPARISON:  Left foot radiographs 09/07/2021 FINDINGS: Left ankle: Normal bone mineralization. The ankle mortise is symmetric and intact. Possible mild anterior and lateral soft tissue swelling. Joint spaces  are preserved. No acute fracture or dislocation. Left foot: Normal bone mineralization. Joint spaces are preserved. No acute fracture is seen. No dislocation. Unchanged minimal/early plantar calcaneal heel spur. IMPRESSION: 1. No acute fracture. 2. Unchanged minimal/early plantar calcaneal heel spur. Electronically Signed   By: Neita Garnet M.D.   On: 07/11/2022 18:50    Procedures Procedures (including critical care time)  Medications Ordered in UC Medications  albuterol (PROVENTIL) (2.5 MG/3ML) 0.083% nebulizer solution 2.5 mg (2.5 mg Nebulization Given 07/11/22 1901)    Initial Impression / Assessment and Plan / UC Course  I have reviewed the triage vital signs and the nursing notes.  Pertinent labs & imaging results that were available during my care of the patient were reviewed by me and considered in my medical decision making (see chart for details).     1.  Ankle and foot pain  X-ray completed that was negative for any acute bony abnormality.  Unsure exact etiology of patient's symptoms given no injury but patient does have some inflammation.  Could be gout but physical exam is not super consistent with that.  Although, prednisone prescribed for asthma exacerbation will help decrease inflammation associated with this too.  Also advised elevation of extremity and ice application.  Advised follow-up with orthopedist if symptoms persist or worsen at provided contact information.  2.  Viral upper respiratory infection  Patient appears to have a viral illness that is causing exacerbation of asthma.  Breathing treatment was administered with improvement in lung sounds and patient stating that he felt better.  Will refill albuterol nebulizer solution and albuterol inhaler for patient to take at home as needed.  Instructed patient these medications are similar and to use at  least 4 to 6 hours apart if in the same day.  Prednisone steroid also prescribed to decrease inflammation associated  this.  Benzonatate prescribed to take as needed for cough.  COVID test pending.  Advised supportive care and symptom management.  Advised return precautions.  Patient verbalized understanding and was agreeable with plan. Final Clinical Impressions(s) / UC Diagnoses   Final diagnoses:  Viral upper respiratory tract infection with cough  Mild intermittent asthma with acute exacerbation  Acute left ankle pain  Left foot pain     Discharge Instructions      Suspect that you have a viral illness causing your asthma to flareup.  I have refilled your albuterol inhaler and prescribed prednisone steroid and a cough medication to alleviate this.  Follow-up if any symptoms persist or worsen.  Your x-ray did not show any obvious abnormalities.  Will do uric acid blood work to check for possible gout.  Prednisone should be helpful with your pain as well.  Elevate extremity and apply ice.  Follow-up with orthopedist if symptoms persist or worsen.     ED Prescriptions     Medication Sig Dispense Auth. Provider   albuterol (VENTOLIN HFA) 108 (90 Base) MCG/ACT inhaler Inhale 1-2 puffs into the lungs every 6 (six) hours as needed for wheezing or shortness of breath. 1 each [place], Rolly Salter E, [place]   predniSONE (DELTASONE) 20 MG tablet Take 2 tablets (40 mg total) by mouth daily for 5 days. 10 tablet [place], [place] E, [place]   benzonatate (TESSALON) 100 MG capsule Take 1 capsule (100 mg total) by mouth every 8 (eight) hours as needed for cough. 21 capsule [place], [place] E, [place]   albuterol (PROVENTIL) (2.5 MG/3ML) 0.083% nebulizer solution Take 3 mLs (2.5 mg total) by nebulization every 6 (six) hours as needed for wheezing or shortness of breath. 75 mL Gustavus Bryant, [place]      PDMP not reviewed this encounter.   Gustavus Bryant, [place] 07/11/22 [phone]

## 2022-07-12 LAB — SARS CORONAVIRUS 2 (TAT 6-24 HRS): SARS Coronavirus 2: NEGATIVE

## 2022-07-13 LAB — URIC ACID: Uric Acid: 9 mg/dL — ABNORMAL HIGH (ref 3.8–8.4)

## 2022-07-17 ENCOUNTER — Ambulatory Visit
Admission: EM | Admit: 2022-07-17 | Discharge: 2022-07-17 | Disposition: A | Discharge status: Home / Self Care | Payer: Self-pay | Attending: Emergency Medicine | Admitting: Emergency Medicine

## 2022-07-17 DIAGNOSIS — J4521 Mild intermittent asthma with (acute) exacerbation: Secondary | ICD-10-CM

## 2022-07-17 MED ORDER — PREDNISONE 20 MG PO TABS: 40.0000 mg | ORAL_TABLET | Freq: Every day | ORAL | 0 refills | Status: DC

## 2022-07-17 MED ORDER — IPRATROPIUM-ALBUTEROL 0.5-2.5 (3) MG/3ML IN SOLN
3.0000 mL | Freq: Once | RESPIRATORY_TRACT | Status: AC
  Administered 2022-07-17: 3 mL via RESPIRATORY_TRACT

## 2022-07-17 NOTE — ED Triage Notes (Signed)
Pt c/o "my chest is tight I need a breathing treatment" onset ~ yesterday

## 2022-07-17 NOTE — Discharge Instructions (Signed)
Today you are being treated for inflammation to your upper airways you have been given a breathing treatment here today in the office to help relax your airway  Begin use of prednisone every morning with food for 5 days to help reduce inflammation   May use albuterol inhaler taking 2 puffs every 4 hours as needed to help calm shortness of breath and wheezing  In addition:  Maintaining adequate hydration may help to thin secretions and soothe the respiratory mucosa   Warm Liquids- Ingestion of warm liquids may have a soothing effect on the respiratory mucosa, increase the flow of nasal mucus, and loosen respiratory secretions, making them easier to remove  May try honey (2.5 to 5 mL [0.5 to 1 teaspoon]) can be given straight or diluted in liquid (juice). Corn syrup may be substituted if honey is not available.

## 2022-07-17 NOTE — ED Provider Notes (Signed)
[facility]    CSN: [id number] Arrival date & time: 07/17/22  1832      History   Chief Complaint Chief Complaint  Patient presents with   breathing    HPI Patrick Clements is a 32 y.o. male.   For evaluation of wheezing, chest tightness and shortness of breath with exertion beginning 1 day ago.  Endorses an intermittent sore throat that caused him to be sent home from work.  Was recently evaluated on 07/10/2021, diagnosed with a viral illness, at that time COVID testing negative and endorses no symptoms did not improve and resolved.  Has attempted use of albuterol inhaler which has been ineffective, history of asthma.  Currently denying fever chills body aches, congestion, ear pain.    Past Medical History:  Diagnosis Date   ADHD (attention deficit hyperactivity disorder)    Asthma    ASTHMA 11/13/2007   Qualifier: Diagnosis of  By: Daphine Deutscher FNP, Nykedtra     Bipolar 1 disorder [facility])    Depression    PNEUMONIA, RIGHT MIDDLE LOBE 11/13/2007   Qualifier: Diagnosis of  By: Daphine Deutscher FNP, Zena Amos      Patient Active Problem List   Diagnosis Date Noted   Primary hypertension 09/19/2021   Morbid obesity (HCC) 09/19/2021   Arthritis of right ankle 07/28/2017   Chronic pain of right ankle 07/28/2017   Moderate persistent asthma 07/28/2017   Recurrent major depressive disorder, in partial remission (HCC) 07/28/2017   Seasonal allergic rhinitis due to pollen 07/28/2017   Vaping nicotine dependence, non-tobacco product 07/28/2017   ACNE VULGARIS 11/13/2007    History reviewed. No pertinent surgical history.     Home Medications    Prior to Admission medications   Medication Sig Start Date End Date Taking? Authorizing Provider  albuterol (PROVENTIL) (2.5 MG/3ML) 0.083% nebulizer solution Take 3 mLs (2.5 mg total) by nebulization every 6 (six) hours as needed for wheezing or shortness of breath. Patient taking differently: Take 2.5 mg by nebulization at bedtime as  needed for wheezing or shortness of breath. 05/10/21   Tomi Bamberger, PA-C  albuterol (PROVENTIL) (2.5 MG/3ML) 0.083% nebulizer solution Take 3 mLs (2.5 mg total) by nebulization every 6 (six) hours as needed for wheezing or shortness of breath. 07/11/22   Gustavus Bryant, FNP  albuterol (VENTOLIN HFA) 108 (90 Base) MCG/ACT inhaler Inhale 2 puffs into the lungs every 6 (six) hours as needed for wheezing or shortness of breath. Patient taking differently: Inhale 2-3 puffs into the lungs 3 (three) times daily as needed for wheezing or shortness of breath. 05/10/21   Tomi Bamberger, PA-C  albuterol (VENTOLIN HFA) 108 (90 Base) MCG/ACT inhaler Inhale 1-2 puffs into the lungs every 6 (six) hours as needed for wheezing or shortness of breath. 07/11/22   Gustavus Bryant, FNP  amLODipine (NORVASC) 5 MG tablet Take 1 tablet (5 mg total) by mouth daily. 09/19/21   Dulce Sellar, NP  benzonatate (TESSALON) 100 MG capsule Take 1 capsule (100 mg total) by mouth every 8 (eight) hours as needed for cough. 07/11/22   Gustavus Bryant, FNP  ibuprofen (ADVIL) 200 MG tablet Take 400 mg by mouth 2 (two) times daily as needed for mild pain.    [provider]    Family History History reviewed. No pertinent family history.  Social History Social History   Tobacco Use   Smoking status: Some Days    Types: Cigarettes    Passive exposure: Yes   Smokeless  tobacco: Never  Vaping Use   Vaping Use: Some days  Substance Use Topics   Alcohol use: Never   Drug use: No     Allergies   Patient has no known allergies.   Review of Systems Review of Systems  Constitutional: Negative.   HENT:  Positive for sore throat. Negative for congestion, dental problem, drooling, ear discharge, ear pain, facial swelling, hearing loss, mouth sores, nosebleeds, postnasal drip, rhinorrhea, sinus pressure, sinus pain, sneezing, tinnitus, trouble swallowing and voice change.   Respiratory:  Positive for cough, shortness of  breath and wheezing. Negative for apnea, choking, chest tightness and stridor.   Cardiovascular: Negative.   Gastrointestinal: Negative.   Skin: Negative.   Neurological: Negative.      Physical Exam Triage Vital Signs ED Triage Vitals  Enc Vitals Group     BP 07/17/22 1845 (!) 142/89     Pulse Rate 07/17/22 1845 92     Resp 07/17/22 1845 18     Temp 07/17/22 1845 98 F (36.7 C)     Temp Source 07/17/22 1845 Oral     SpO2 07/17/22 1845 93 %     Weight --      Height --      Head Circumference --      Peak Flow --      Pain Score 07/17/22 1846 0     Pain Loc --      Pain Edu? --      Excl. in GC? --    No data found.  Updated Vital Signs BP (!) 142/89 (BP Location: Right Arm)   Pulse 92   Temp 98 F (36.7 C) (Oral)   Resp 18   SpO2 93%   Visual Acuity Right Eye Distance:   Left Eye Distance:   Bilateral Distance:    Right Eye Near:   Left Eye Near:    Bilateral Near:     Physical Exam Constitutional:      Appearance: Normal appearance.  Eyes:     Extraocular Movements: Extraocular movements intact.  Cardiovascular:     Rate and Rhythm: Normal rate and regular rhythm.     Pulses: Normal pulses.     Heart sounds: Normal heart sounds.  Pulmonary:     Effort: Pulmonary effort is normal.     Breath sounds: Wheezing present.  Neurological:     Mental Status: He is alert and oriented to person, place, and time. Mental status is at baseline.      UC Treatments / Results  Labs (all labs ordered are listed, but only abnormal results are displayed) Labs Reviewed - No data to display  EKG   Radiology No results found.  Procedures Procedures (including critical care time)  Medications Ordered in UC Medications - No data to display  Initial Impression / Assessment and Plan / UC Course  I have reviewed the triage vital signs and the nursing notes.  Pertinent labs & imaging results that were available during my care of the patient were reviewed by  me and considered in my medical decision making (see chart for details).  Mild intermittent asthma with acute exacerbation  Vital signs are stable, O2 saturation 93% on room air, mild wheezing heard to the right and left upper lobes, otherwise clear, stable for outpatient management, DuoNeb given in office, symptoms have begun to resolve on reevaluation, endorses enough medicine on inhalers and therefore advised continued use as directed, may follow-up with his primary doctor if symptoms persist  or worsen, work note given Final Clinical Impressions(s) / UC Diagnoses   Final diagnoses:  None   Discharge Instructions   None    ED Prescriptions   None    PDMP not reviewed this encounter.   Valinda Hoar, NP 07/17/22 [phone]

## 2023-01-27 ENCOUNTER — Ambulatory Visit (HOSPITAL_COMMUNITY)
Admission: EM | Admit: 2023-01-27 | Discharge: 2023-01-27 | Disposition: A | Discharge status: Home / Self Care | Payer: Self-pay | Attending: Emergency Medicine | Admitting: Emergency Medicine

## 2023-01-27 ENCOUNTER — Other Ambulatory Visit: Payer: Self-pay

## 2023-01-27 DIAGNOSIS — J4541 Moderate persistent asthma with (acute) exacerbation: Secondary | ICD-10-CM

## 2023-01-27 DIAGNOSIS — I10 Essential (primary) hypertension: Secondary | ICD-10-CM

## 2023-01-27 DIAGNOSIS — Z5971 Insufficient health insurance coverage: Secondary | ICD-10-CM

## 2023-01-27 MED ORDER — IPRATROPIUM-ALBUTEROL 0.5-2.5 (3) MG/3ML IN SOLN: 3.0000 mL | Freq: Four times a day (QID) | RESPIRATORY_TRACT | 0 refills | Status: AC | PRN

## 2023-01-27 MED ORDER — METHYLPREDNISOLONE 4 MG PO TBPK: ORAL_TABLET | ORAL | 0 refills | Status: AC

## 2023-01-27 MED ORDER — IPRATROPIUM-ALBUTEROL 0.5-2.5 (3) MG/3ML IN SOLN
3.0000 mL | Freq: Once | RESPIRATORY_TRACT | Status: AC
  Administered 2023-01-27: 3 mL via RESPIRATORY_TRACT

## 2023-01-27 MED ORDER — IPRATROPIUM-ALBUTEROL 0.5-2.5 (3) MG/3ML IN SOLN
RESPIRATORY_TRACT | Status: AC
  Filled 2023-01-27: qty 3

## 2023-01-27 MED ORDER — METHYLPREDNISOLONE ACETATE 80 MG/ML IJ SUSP
80.0000 mg | Freq: Once | INTRAMUSCULAR | Status: AC
  Administered 2023-01-27: 80 mg via INTRAMUSCULAR

## 2023-01-27 MED ORDER — AMLODIPINE BESYLATE 5 MG PO TABS
5.0000 mg | ORAL_TABLET | Freq: Every day | ORAL | 2 refills | Status: AC
Start: 2023-01-27 — End: 2023-04-27

## 2023-01-27 MED ORDER — TRELEGY ELLIPTA 100-62.5-25 MCG/ACT IN AEPB: 1.0000 | INHALATION_SPRAY | Freq: Every day | RESPIRATORY_TRACT | 2 refills | Status: AC

## 2023-01-27 MED ORDER — METHYLPREDNISOLONE ACETATE 80 MG/ML IJ SUSP
INTRAMUSCULAR | Status: AC
  Filled 2023-01-27: qty 1

## 2023-01-27 NOTE — ED Provider Notes (Signed)
[facility]    CSN: [id number] Arrival date & time: 01/27/23  0818    HISTORY   Chief Complaint  Patient presents with   asthma   HPI Patrick Clements is a pleasant, 32 y.o. male who presents to urgent care today. Patient reports a history of asthma, states he has been using his nebulizer treatment at home, ran out last night.  Patient states that this morning he woke up still feeling shortness of breath and also believes that he is continuing to wheeze.  Patient states he currently does not use daily maintenance inhalers.  Patient states he currently does not have health insurance.  Patient states he smokes occasionally when he is drinking but not daily.  Patient denies known history of allergies.  Patient also has history of hypertension, states he is not currently taking amlodipine every day.  Blood pressure is elevated on arrival today.  Patient has O2 sat of 91% on arrival but is otherwise well-appearing.  The history is provided by the patient.   Past Medical History:  Diagnosis Date   ADHD (attention deficit hyperactivity disorder)    Asthma    ASTHMA 11/13/2007   Qualifier: Diagnosis of  By: Daphine Deutscher FNP, [place]kedtra     Bipolar 1 disorder [facility])    Depression    PNEUMONIA, RIGHT MIDDLE LOBE 11/13/2007   Qualifier: Diagnosis of  By: Daphine Deutscher FNP, Zena Amos     Patient Active Problem List   Diagnosis Date Noted   Primary hypertension 09/19/2021   Morbid obesity (HCC) 09/19/2021   Arthritis of right ankle 07/28/2017   Chronic pain of right ankle 07/28/2017   Moderate persistent asthma 07/28/2017   Recurrent major depressive disorder, in partial remission (HCC) 07/28/2017   Seasonal allergic rhinitis due to pollen 07/28/2017   Vaping nicotine dependence, non-tobacco product 07/28/2017   ACNE VULGARIS 11/13/2007   No past surgical history on file.  Home Medications    Prior to Admission medications   Medication Sig Start Date End Date Taking? Authorizing  Provider  Fluticasone-Umeclidin-Vilant (TRELEGY ELLIPTA) 100-62.5-25 MCG/ACT AEPB Inhale 1 Inhalation into the lungs daily. 01/27/23 04/27/23 Yes Theadora Rama Scales, PA-C  ipratropium-albuterol (DUONEB) 0.5-2.5 (3) MG/3ML SOLN Take 3 mLs by nebulization every 6 (six) hours as needed. 01/27/23 02/26/23 Yes Theadora Rama Scales, PA-C  methylPREDNISolone (MEDROL DOSEPAK) 4 MG TBPK tablet Take 24 mg on day 1, 20 mg on day 2, 16 mg on day 3, 12 mg on day 4, 8 mg on day 5, 4 mg on day 6.  Take all tablets in each row at once, do not spread tablets out throughout the day. 01/27/23  Yes Theadora Rama Scales, PA-C  amLODipine (NORVASC) 5 MG tablet Take 1 tablet (5 mg total) by mouth daily. 01/27/23 04/27/23  Theadora Rama Scales, PA-C    Family History No family history on file. Social History Social History   Tobacco Use   Smoking status: Some Days    Types: Cigarettes    Passive exposure: Yes   Smokeless tobacco: Never  Vaping Use   Vaping status: Some Days  Substance Use Topics   Alcohol use: Never   Drug use: No   Allergies   Patient has no known allergies.  Review of Systems Review of Systems Pertinent findings revealed after performing a 14 point review of systems has been noted in the history of present illness.  Physical Exam Vital Signs BP (!) 141/89   Pulse 94   Resp 18   SpO2 91%  No data found.  Physical Exam Vitals and nursing note reviewed.  Constitutional:      General: He is not in acute distress.    Appearance: Normal appearance. He is normal weight. He is not ill-appearing.  HENT:     Head: Normocephalic and atraumatic.  Eyes:     Extraocular Movements: Extraocular movements intact.     Conjunctiva/sclera: Conjunctivae normal.     Pupils: Pupils are equal, round, and reactive to light.  Cardiovascular:     Rate and Rhythm: Normal rate and regular rhythm.     Chest Wall: PMI is not displaced. No thrill.     Heart sounds: Normal heart sounds, S1  normal and S2 normal. Heart sounds not distant. No murmur heard. Pulmonary:     Effort: Pulmonary effort is normal. No tachypnea, bradypnea, accessory muscle usage, prolonged expiration or respiratory distress.     Breath sounds: Examination of the right-upper field reveals wheezing. Examination of the left-upper field reveals wheezing. Examination of the right-middle field reveals wheezing. Examination of the left-middle field reveals wheezing. Examination of the right-lower field reveals wheezing. Examination of the left-lower field reveals wheezing. Wheezing present.  Musculoskeletal:        General: Normal range of motion.     Cervical back: Normal range of motion and neck supple.  Skin:    General: Skin is warm and dry.  Neurological:     General: No focal deficit present.     Mental Status: He is alert and oriented to person, place, and time. Mental status is at baseline.  Psychiatric:        Mood and Affect: Mood normal.        Behavior: Behavior normal.        Thought Content: Thought content normal.        Judgment: Judgment normal.     Visual Acuity Right Eye Distance:   Left Eye Distance:   Bilateral Distance:    Right Eye Near:   Left Eye Near:    Bilateral Near:     UC Couse / Diagnostics / Procedures:     Radiology No results found.  Procedures Procedures (including critical care time) EKG  Pending results:  Labs Reviewed - No data to display  Medications Ordered in UC: Medications  ipratropium-albuterol (DUONEB) 0.5-2.5 (3) MG/3ML nebulizer solution 3 mL (3 mLs Nebulization Given 01/27/23 1006)  methylPREDNISolone acetate (DEPO-MEDROL) injection 80 mg (80 mg Intramuscular Given 01/27/23 0959)    UC Diagnoses / Final Clinical Impressions(s)   I have reviewed the triage vital signs and the nursing notes.  Pertinent labs & imaging results that were available during my care of the patient were reviewed by me and considered in my medical decision making  (see chart for details).    Final diagnoses:  Moderate persistent asthma not dependent on systemic steroids with acute exacerbation  Essential hypertension  Does not have health insurance   Patient reported a good response to DuoNeb treatment with significant improvement of wheezing.  Patient also provided with Depo-Medrol injection during his visit.  Patient provided with prescription for DuoNeb Respules for his nebulizer as well as a prescription for Trelegy.  I located a good Rx coupon for the DuoNeb Respules and a manufactures coupon for Trelegy for the patient.  Patient given instructions on use for both medications.  Patient was provided with a refill of his amlodipine and encouraged to take it daily.  Patient was advised that I recommend close follow-up with his PCP  once he gets his insurance back which she believes will be in 30 days, states there was an error with his Medicaid coverage.  Conservative care recommended.  Return cautions advised.  Please see discharge instructions below for details of plan of care as provided to patient. ED Prescriptions     Medication Sig Dispense Auth. Provider   amLODipine (NORVASC) 5 MG tablet Take 1 tablet (5 mg total) by mouth daily. 30 tablet Theadora Rama Scales, PA-C   ipratropium-albuterol (DUONEB) 0.5-2.5 (3) MG/3ML SOLN Take 3 mLs by nebulization every 6 (six) hours as needed. 360 mL Theadora Rama Scales, PA-C   Fluticasone-Umeclidin-Vilant (TRELEGY ELLIPTA) 100-62.5-25 MCG/ACT AEPB Inhale 1 Inhalation into the lungs daily. 1 each Theadora Rama Scales, PA-C   methylPREDNISolone (MEDROL DOSEPAK) 4 MG TBPK tablet Take 24 mg on day 1, 20 mg on day 2, 16 mg on day 3, 12 mg on day 4, 8 mg on day 5, 4 mg on day 6.  Take all tablets in each row at once, do not spread tablets out throughout the day. 21 tablet Theadora Rama Scales, PA-C      PDMP not reviewed this encounter.  Pending results:  Labs Reviewed - No data to  display  Discharge Instructions:   Discharge Instructions      Please read below to learn more about the medications, dosages and frequencies that I recommend to help alleviate your symptoms and to get you feeling better soon:   Depo-Medrol IM (methylprednisolone):  To quickly address your significant respiratory inflammation, you were provided with an injection of Solu-Medrol in the office today.  You should continue to feel the full benefit of the steroid for the next 8 to 12 hours.   Medrol Dosepak (methylprednisolone): This is a steroid that will significantly calm your upper and lower airways.  Please take one row of tablets daily with food starting tomorrow until the prescription is complete.  I recommend that you take your first dose after your interview tomorrow because this medication may make you feel little bit jumpy.   Combivent, DuoNeb (ipratropium and albuterol): This inhaled medication contains a short acting beta agonist bronchodilator and a muscarinic bronchodilator.  This medication works on the smooth muscle that opens and constricts of your airways by relaxing the muscle.  The result of relaxation of the smooth muscle is increased air movement and improved work of breathing.  This is a short acting medication that can be used every 4-6 hours as needed for increased work of breathing, shortness of breath, wheezing and excessive coughing.  I provided you with a good Rx coupon to use at the pharmacy.   Trelegy (fluticasone, vilanterol and umeclidinium):  This daily inhaled, MAINTENANCE medication contains an inhaled corticosteroid (fluticasone), a muscarinic bronchodilator (umeclidinium) and a long-acting form of albuterol (vilanterol).  The inhaled steroid and this medication  is not absorbed into the body and will not cause side effects such as increased blood sugar levels, irritability, sleeplessness or weight gain.  Inhaled corticosteroids are sort of like topical steroid creams  but, as you can imagine, it is not practical to attempt to rub a steroid cream inside of your lungs.  The long-acting albuterol and muscarinic bronchodilator works similarly to the short acting albuterol and Spiriva (ipratropium) that are found in rescue inhalers.  They provide 24-hour relaxation of the smooth muscles that open and constrict your airways.  Short acting rescue inhalers can only provide this benefit for a few hours.  Please feel free to  continue using albuterol (your short acting rescue inhaler) as often as needed throughout the day for shortness of breath, wheezing, and cough.  As we discussed, if the coupon I provided for you have does not bring the cost of this medication down to to a reasonable amount, please do not purchase it.  Believe that you will do okay with the DuoNeb treatments 4 times daily until you can see your primary care provider and your insurance kicks back in.    If symptoms have not meaningfully improved in the next 3 to 5 days, please return for repeat evaluation or follow-up with your regular provider.  If symptoms have worsened in the next 3 to 5 days, please return for repeat evaluation or follow-up with your regular provider.    Thank you for visiting urgent care today.  We appreciate the opportunity to participate in your care.     Disposition Upon Discharge:  Condition: stable for discharge home  Patient presented with an acute illness with associated systemic symptoms and significant discomfort requiring urgent management. In my opinion, this is a condition that a prudent lay person (someone who possesses an average knowledge of health and medicine) may potentially expect to result in complications if not addressed urgently such as respiratory distress, impairment of bodily function or dysfunction of bodily organs.   Routine symptom specific, illness specific and/or disease specific instructions were discussed with the patient and/or caregiver at length.    As such, the patient has been evaluated and assessed, work-up was performed and treatment was provided in alignment with urgent care protocols and evidence based medicine.  Patient/parent/caregiver has been advised that the patient may require follow up for further testing and treatment if the symptoms continue in spite of treatment, as clinically indicated and appropriate.  Patient/parent/caregiver has been advised to return to the [facility] or PCP if no better; to PCP or the Emergency Department if new signs and symptoms develop, or if the current signs or symptoms continue to change or worsen for further workup, evaluation and treatment as clinically indicated and appropriate  The patient will follow up with their current PCP if and as advised. If the patient does not currently have a PCP we will assist them in obtaining one.   The patient may need specialty follow up if the symptoms continue, in spite of conservative treatment and management, for further workup, evaluation, consultation and treatment as clinically indicated and appropriate.  Patient/parent/caregiver verbalized understanding and agreement of plan as discussed.  All questions were addressed during visit.  Please see discharge instructions below for further details of plan.  This office note has been dictated using Teaching laboratory technician.  Unfortunately, this method of dictation can sometimes lead to typographical or grammatical errors.  I apologize for your inconvenience in advance if this occurs.  Please do not hesitate to reach out to me if clarification is needed.      Theadora Rama Scales, PA-C 01/27/23 1037

## 2023-01-27 NOTE — ED Triage Notes (Signed)
Pt reports he used all his Neb and inhaler meds last night. Pt still feels SHOB and wheezing.

## 2023-01-27 NOTE — Discharge Instructions (Signed)
Please read below to learn more about the medications, dosages and frequencies that I recommend to help alleviate your symptoms and to get you feeling better soon:   Depo-Medrol IM (methylprednisolone):  To quickly address your significant respiratory inflammation, you were provided with an injection of Solu-Medrol in the office today.  You should continue to feel the full benefit of the steroid for the next 8 to 12 hours.   Medrol Dosepak (methylprednisolone): This is a steroid that will significantly calm your upper and lower airways.  Please take one row of tablets daily with food starting tomorrow until the prescription is complete.  I recommend that you take your first dose after your interview tomorrow because this medication may make you feel little bit jumpy.   Combivent, DuoNeb (ipratropium and albuterol): This inhaled medication contains a short acting beta agonist bronchodilator and a muscarinic bronchodilator.  This medication works on the smooth muscle that opens and constricts of your airways by relaxing the muscle.  The result of relaxation of the smooth muscle is increased air movement and improved work of breathing.  This is a short acting medication that can be used every 4-6 hours as needed for increased work of breathing, shortness of breath, wheezing and excessive coughing.  I provided you with a good Rx coupon to use at the pharmacy.   Trelegy (fluticasone, vilanterol and umeclidinium):  This daily inhaled, MAINTENANCE medication contains an inhaled corticosteroid (fluticasone), a muscarinic bronchodilator (umeclidinium) and a long-acting form of albuterol (vilanterol).  The inhaled steroid and this medication  is not absorbed into the body and will not cause side effects such as increased blood sugar levels, irritability, sleeplessness or weight gain.  Inhaled corticosteroids are sort of like topical steroid creams but, as you can imagine, it is not practical to attempt to rub a  steroid cream inside of your lungs.  The long-acting albuterol and muscarinic bronchodilator works similarly to the short acting albuterol and Spiriva (ipratropium) that are found in rescue inhalers.  They provide 24-hour relaxation of the smooth muscles that open and constrict your airways.  Short acting rescue inhalers can only provide this benefit for a few hours.  Please feel free to continue using albuterol (your short acting rescue inhaler) as often as needed throughout the day for shortness of breath, wheezing, and cough.  As we discussed, if the coupon I provided for you have does not bring the cost of this medication down to to a reasonable amount, please do not purchase it.  Believe that you will do okay with the DuoNeb treatments 4 times daily until you can see your primary care provider and your insurance kicks back in.    If symptoms have not meaningfully improved in the next 3 to 5 days, please return for repeat evaluation or follow-up with your regular provider.  If symptoms have worsened in the next 3 to 5 days, please return for repeat evaluation or follow-up with your regular provider.    Thank you for visiting urgent care today.  We appreciate the opportunity to participate in your care.

## 2023-08-18 ENCOUNTER — Emergency Department (HOSPITAL_COMMUNITY)
Admission: EM | Admit: 2023-08-18 | Discharge: 2023-08-18 | Disposition: A | Discharge status: Home / Self Care | Payer: Self-pay | Attending: Emergency Medicine | Admitting: Emergency Medicine

## 2023-08-18 ENCOUNTER — Other Ambulatory Visit: Payer: Self-pay

## 2023-08-18 ENCOUNTER — Encounter (HOSPITAL_COMMUNITY): Payer: Self-pay

## 2023-08-18 ENCOUNTER — Other Ambulatory Visit (HOSPITAL_COMMUNITY): Payer: Self-pay

## 2023-08-18 DIAGNOSIS — Z7901 Long term (current) use of anticoagulants: Secondary | ICD-10-CM | POA: Insufficient documentation

## 2023-08-18 DIAGNOSIS — Z79899 Other long term (current) drug therapy: Secondary | ICD-10-CM | POA: Insufficient documentation

## 2023-08-18 DIAGNOSIS — Z7951 Long term (current) use of inhaled steroids: Secondary | ICD-10-CM | POA: Insufficient documentation

## 2023-08-18 DIAGNOSIS — I10 Essential (primary) hypertension: Secondary | ICD-10-CM | POA: Insufficient documentation

## 2023-08-18 DIAGNOSIS — J45909 Unspecified asthma, uncomplicated: Secondary | ICD-10-CM | POA: Insufficient documentation

## 2023-08-18 DIAGNOSIS — I824Y1 Acute embolism and thrombosis of unspecified deep veins of right proximal lower extremity: Secondary | ICD-10-CM | POA: Insufficient documentation

## 2023-08-18 MED ORDER — APIXABAN 5 MG PO TABS
ORAL_TABLET | ORAL | 0 refills | Status: AC
  Filled 2023-08-18: qty 60, 23d supply, fill #0

## 2023-08-18 MED ORDER — APIXABAN (ELIQUIS) EDUCATION KIT FOR DVT/PE PATIENTS: PACK | Freq: Once | Status: DC

## 2023-08-18 NOTE — Discharge Summary (Signed)
 TOC consulted regarding pt needing medication assistance an help applying for Medicaid.  RNCM suggests sending Rx to [facility] pharmacy and [facility] will follow up with payment needed.  RNCM consulted regarding PCP establishment and insurance enrollment. Pt presented to [facility] ED today with DVT. RNCM met with pt at bedside; pt confirms not having access to follow up care with PCP or insurance coverage. Discussed with patient importance and benefits of establishing PCP, and not utilizing the ED for primary care needs. Pt verbalized understanding and is in agreement. Discussed other options, provided list of local  affordable PCPs. RNCM obtained appointment on  June 4 @ 1:30 with Kathlee Pap , NP and placed on After Visit Summary paperwork.  No further case management needs communicated at this time. Heylee Tant J. Rachel Budds, BSN, RN, [place] [phone]

## 2023-08-18 NOTE — ED Provider Triage Note (Addendum)
 Emergency Medicine Provider Triage Evaluation Note  Patrick Clements , a 33 y.o. male  was evaluated in triage.  Pt complains of right leg pain, onset last week. Went to [facility] ER, told gout initially, then told DVT. Couldn't blood thinners (eliquis) due to lack of funds.  Review of Systems  Positive:  Negative: SHOB, CP  Physical Exam  There were no vitals taken for this visit. Gen:   Awake, no distress   Resp:  Normal effort  MSK:   Moves extremities without difficulty  Other:    Medical Decision Making  Medically screening exam initiated at 2:39 PM.  Appropriate orders placed.  Patrick Clements was informed that the remainder of the evaluation will be completed by another provider, this initial triage assessment does not replace that evaluation, and the importance of remaining in the ED until their evaluation is complete.  Yvonnie Heritage, MD - 08/15/2023  Formatting of this note might be different from the original.  TECHNIQUE: The veins of right lower extremity were interrogated from the visible common femoral vein to the distal popliteal vein.  The junction of the greater saphenous with the common femoral vein as well as the posterior tibial veins were evaluated.  Gray scale, color, spectral, and doppler sonography was utilized.  COMPARISON: None.   INDICATION: Swelling    FINDINGS: Nonocclusive thrombus in the right mid femoral vein. Otherwise, Flow is present in all interrogated vessels and there are no internal echoes.  Normal venous compressibility is demonstrated and there is augmentation of flow with appropriate maneuvers.    INCIDENTAL FINDINGS:  Right inguinal node measuring 3.6 x 1.3 x 1.9 cm, probably reactive.    IMPRESSION:   Deep venous thrombosis in the right mid femoral vein.   ###CODE CRITICAL REPORT###  (automated ordering provider notification pathway initiated at 08/15/2023 7:13 PM)    Darlis Eisenmenger, PA-C 08/18/23 1440    Darlis Eisenmenger, PA-C 08/18/23  1441

## 2023-08-18 NOTE — ED Provider Notes (Signed)
 [facility] DEPARTMENT AT [facility] Provider Note   CSN: [id number] Arrival date & time: 08/18/23  1417     History  Chief Complaint  Patient presents with   Claudication    Patrick Clements is a 33 y.o. male with history of ADHD, asthma, bipolar presents with complaints of right lower extremity swelling and pain.  Was recently at an ED in [place], ultrasound confirmed DVT at that time.  He has never had a blood clot.  HPI    Past Medical History:  Diagnosis Date   ADHD (attention deficit hyperactivity disorder)    Asthma    ASTHMA 11/13/2007   Qualifier: Diagnosis of  By: Gaylyn Keas FNP, [place]     Bipolar 1 disorder [facility])    Depression    PNEUMONIA, RIGHT MIDDLE LOBE 11/13/2007   Qualifier: Diagnosis of  By: Gaylyn Keas FNP, [place]       Home Medications Prior to Admission medications   Medication Sig Start Date End Date Taking? Authorizing Provider  apixaban (ELIQUIS) 5 MG TABS tablet Take 2 tablets (10mg ) twice daily for 7 days, then 1 tablet (5mg ) twice daily 08/18/23  Yes Felicie Horning, PA-C  amLODipine  (NORVASC ) 5 MG tablet Take 1 tablet (5 mg total) by mouth daily. 01/27/23 04/27/23  Eloise Hake Scales, PA-C  ipratropium-albuterol  (DUONEB) 0.5-2.5 (3) MG/3ML SOLN Take 3 mLs by nebulization every 6 (six) hours as needed. 01/27/23 02/26/23  Eloise Hake Scales, PA-C  methylPREDNISolone  (MEDROL  DOSEPAK) 4 MG TBPK tablet Take 24 mg on day 1, 20 mg on day 2, 16 mg on day 3, 12 mg on day 4, 8 mg on day 5, 4 mg on day 6.  Take all tablets in each row at once, do not spread tablets out throughout the day. 01/27/23   Eloise Hake Scales, PA-C      Allergies    Patient has no known allergies.    Review of Systems   Review of Systems  Musculoskeletal:  Positive for myalgias.    Physical Exam Updated Vital Signs BP (!) 164/110 (BP Location: Right Arm)   Pulse 75   Temp 98.3 F (36.8 C) (Oral)   Resp 18   SpO2 100%  Physical  Exam Vitals and nursing note reviewed.  Constitutional:      General: He is not in acute distress.    Appearance: He is well-developed.  HENT:     Head: Normocephalic and atraumatic.  Eyes:     Conjunctiva/sclera: Conjunctivae normal.  Cardiovascular:     Rate and Rhythm: Normal rate and regular rhythm.     Heart sounds: No murmur heard. Pulmonary:     Effort: Pulmonary effort is normal. No respiratory distress.     Breath sounds: Normal breath sounds.  Abdominal:     Palpations: Abdomen is soft.     Tenderness: There is no abdominal tenderness.  Musculoskeletal:        General: Swelling and tenderness present.     Cervical back: Neck supple.     Comments: DP, PT pulses 2+  Skin:    General: Skin is warm and dry.     Capillary Refill: Capillary refill takes less than 2 seconds.  Neurological:     Mental Status: He is alert.  Psychiatric:        Mood and Affect: Mood normal.     ED Results / Procedures / Treatments   Labs (all labs ordered are listed, but only abnormal results are displayed) Labs Reviewed -  No data to display  EKG None  Radiology No results found.  Procedures Procedures    Medications Ordered in ED Medications - No data to display  ED Course/ Medical Decision Making/ A&P                                 Medical Decision Making Risk Prescription drug management.   This patient presents to the ED with chief complaint(s) of leg swelling.  The complaint involves an extensive differential diagnosis and also carries with it a high risk of complications and morbidity.   Pertinent past medical history as listed in HPI  The differential diagnosis includes  DVT, cellulitis septic joint Additional history obtained: Records reviewed Care Everywhere/External Records  Assessment and management:   Patient presents hypertensive 164/110 with complaints of right lower extremity swelling and pain.  Was recently evaluated at ED in [place].  Diagnosed  with a DVT.  Prescription for Eliquis was sent into his pharmacy, however due to insurance issues he was unable to pick it up.  He is here requesting assistance given the Eliquis.  He denies any chest pain or shortness of breath.  On exam he has trace right lower extremity edema with tenderness, DP, PT pulses are 2+, no erythema or warmth.  Tolerates full range of motion without discomfort, do not suspect septic joint.  He did have recent lab work in the past week confirming competent kidney function.  Do not feel that any repeat lab work is indicated.  Will provide him with Eliquis starter kit here today.  Independent ECG interpretation:  none  Independent labs interpretation:  The following labs were independently interpreted:  Reviewed recent lab work which demonstrated normal kidney function  Independent visualization and interpretation of imaging: I independently visualized the following imaging with scope of interpretation limited to determining acute life threatening conditions related to emergency care: none    Consultations obtained:   none  Disposition:   Patient will be discharged home. The patient has been appropriately medically screened and/or stabilized in the ED. I have low suspicion for any other emergent medical condition which would require further screening, evaluation or treatment in the ED or require inpatient management. At time of discharge the patient is hemodynamically stable and in no acute distress. I have discussed work-up results and diagnosis with patient and answered all questions. Patient is agreeable with discharge plan. We discussed strict return precautions for returning to the emergency department and they verbalized understanding.     Social Determinants of Health:   Patient's lack of insurance  increases the complexity of managing their presentation  This note was dictated with voice recognition software.  Despite best efforts at proofreading, errors may  have occurred which can change the documentation meaning.          Final Clinical Impression(s) / ED Diagnoses Final diagnoses:  Acute deep vein thrombosis (DVT) of proximal vein of right lower extremity [facility])    Rx / DC Orders ED Discharge Orders          Ordered    apixaban (ELIQUIS) 5 MG TABS tablet        08/18/23 1654              Felicie Horning, PA-C 08/18/23 1754    Dalene Duck, MD 08/18/23 [phone]

## 2023-08-18 NOTE — Discharge Instructions (Addendum)
 Your evaluated in the emergency room for a blood clot.  A prescription for Eliquis was sent to your pharmacy.  Please take this as instructed.

## 2023-08-18 NOTE — ED Triage Notes (Signed)
 PT arrives via POV. Pt reports being seen at [facility] ED last week and was told that he had a blood clot in his right leg. Pt arrives AxOx4. Reports he wanted a second opinion. He states was unable to get his eliquis and bp meds due to not having insurance. Pt denies cp or sob.

## 2023-08-18 NOTE — Discharge Planning (Signed)
 RNCM confirmed with [facility] that pt is eligible to utilize 30-day coupon for Eliquis savings.Aaron Aas  RNCM relayed information to EDP to send Rx to [facility] to have filled.

## 2023-08-20 ENCOUNTER — Inpatient Hospital Stay: Payer: Self-pay | Admitting: Family

## 2023-12-10 IMAGING — CT CT ANGIO CHEST
3 of 7 series · 16 of 46 positions shown · IV contrast (APPLIED)
Comparison: 06/25/2021 chest x-ray

CLINICAL DATA: Chest pain and shortness of breath since [REDACTED]

EXAM:
CT ANGIOGRAPHY CHEST WITH CONTRAST
TECHNIQUE: Multidetector CT imaging of the chest was performed using the
standard protocol during bolus administration of intravenous
contrast. Multiplanar CT image reconstructions and MIPs were
obtained to evaluate the vascular anatomy.

[Series 5: arterial · axial · arterial · 0.65mm/px · z∈[+1160,+1364]mm · 5 of 154 slices shown]
[im 26/154  lung]
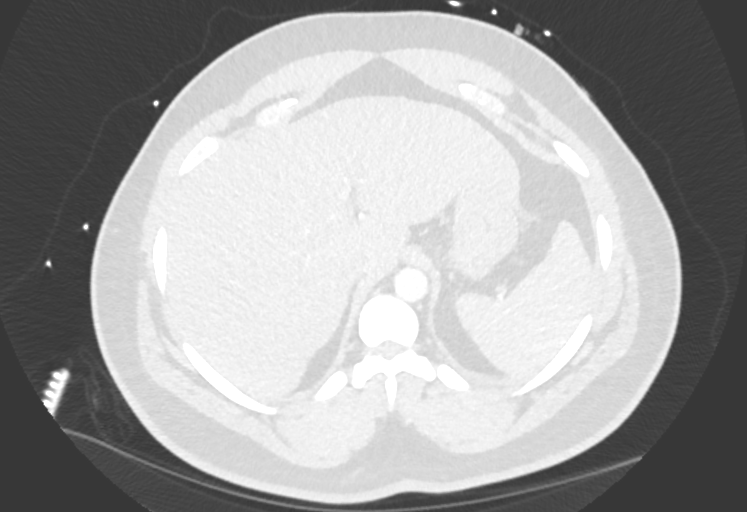
[im 52/154  soft-tissue]
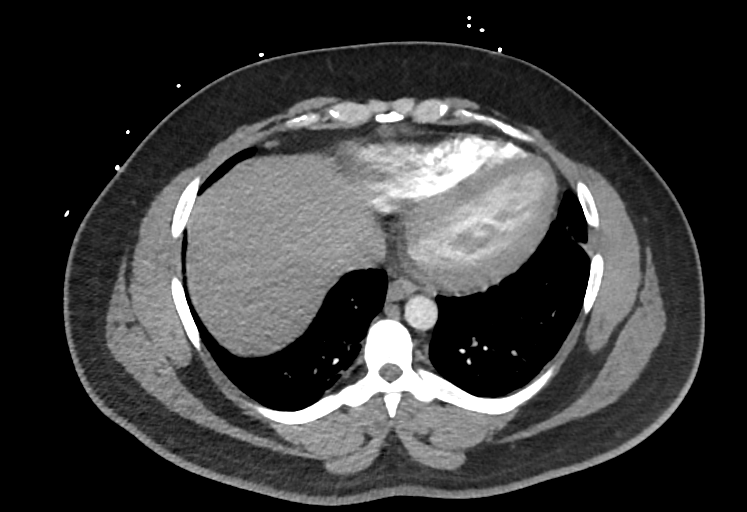
[im 77/154  lung]
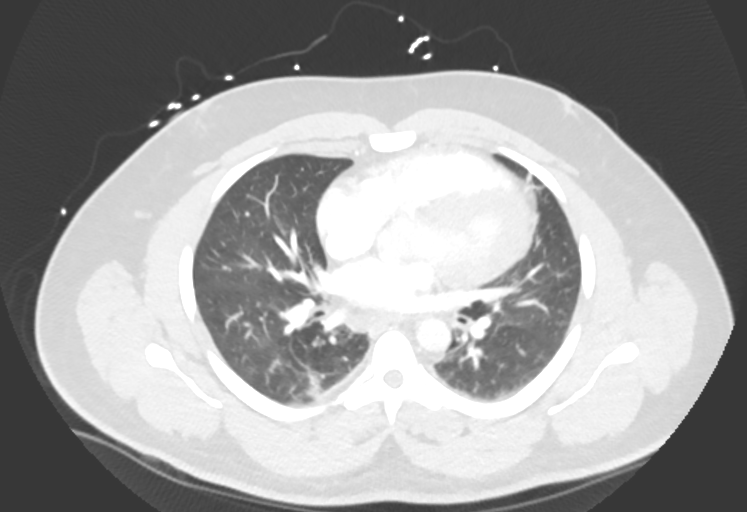
[im 103/154  soft-tissue]
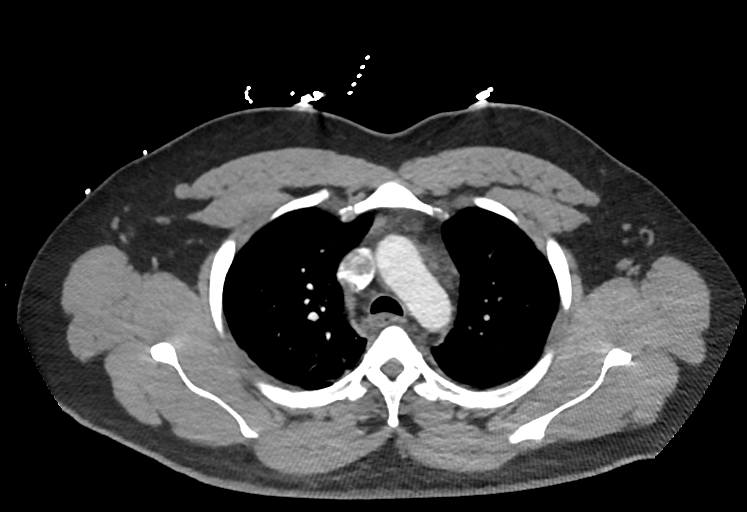
[im 128/154  lung]
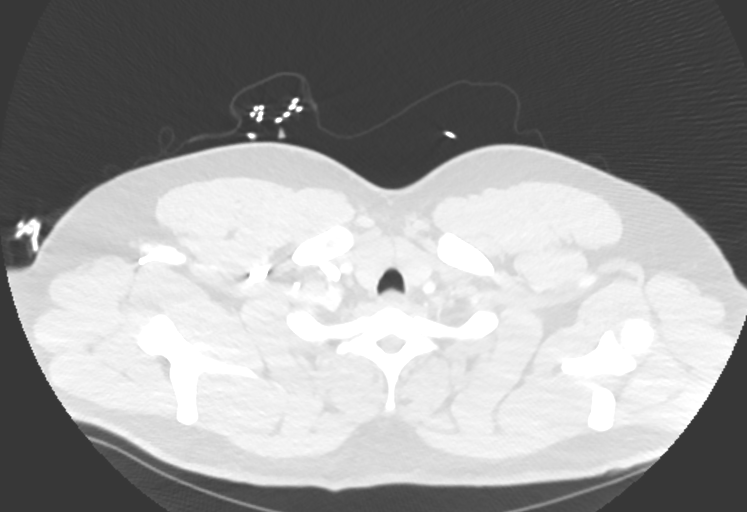

[Series 7: thins · axial · 0.76mm/px · z∈[+1166,+1398]mm · 8 of 404 slices shown]
[im 48/404  lung]
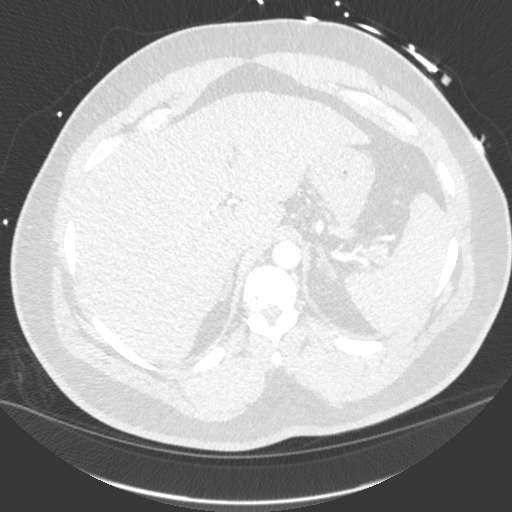
[im 95/404  lung]
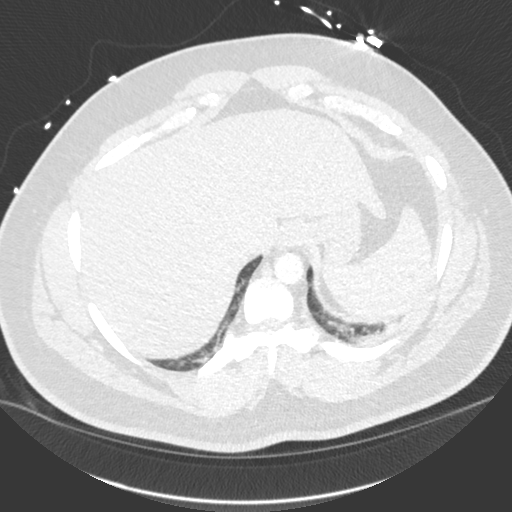
[im 143/404  lung]
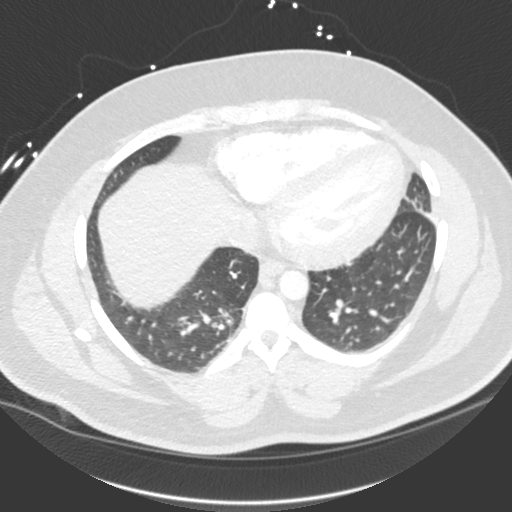
[im 190/404  lung]
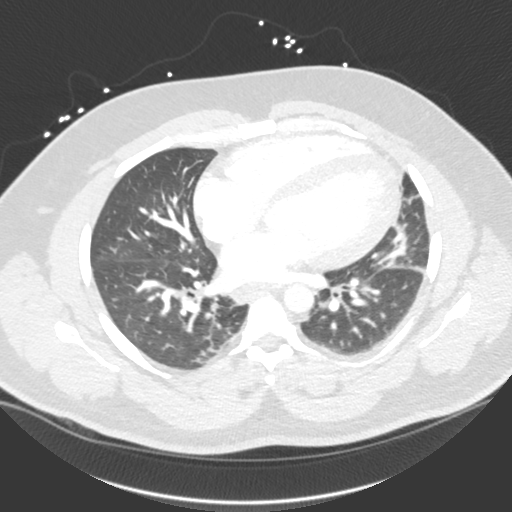
[im 238/404  lung]
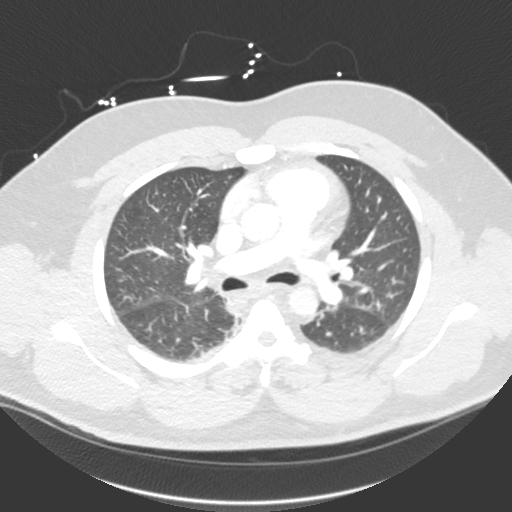
[im 285/404  lung]
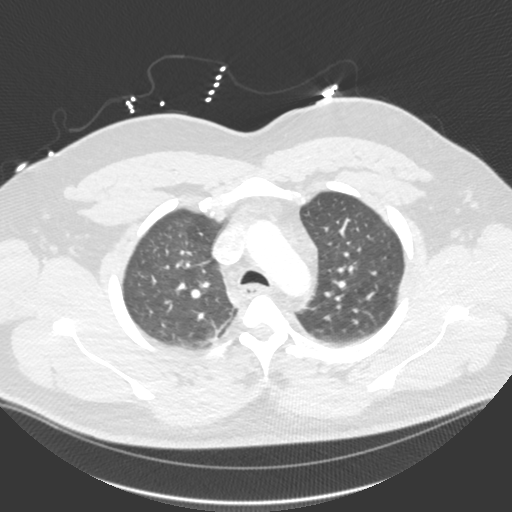
[im 332/404  lung]
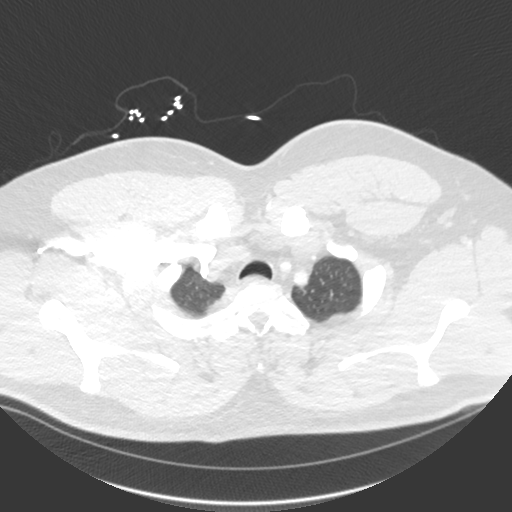
[im 380/404  lung]
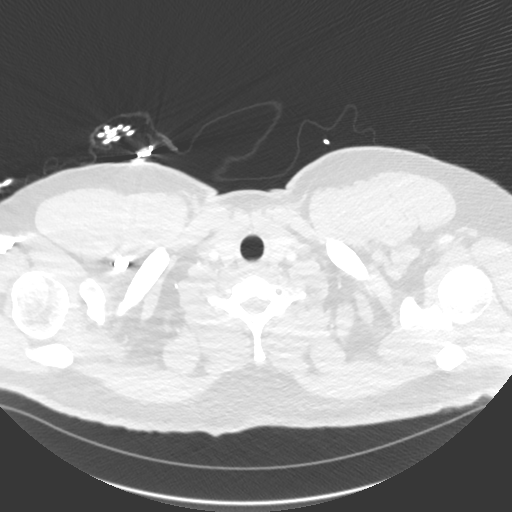

[Series 8: cor · coronal · 0.60mm/px · 3 of 167 slices shown]
[im 42/167  soft-tissue]
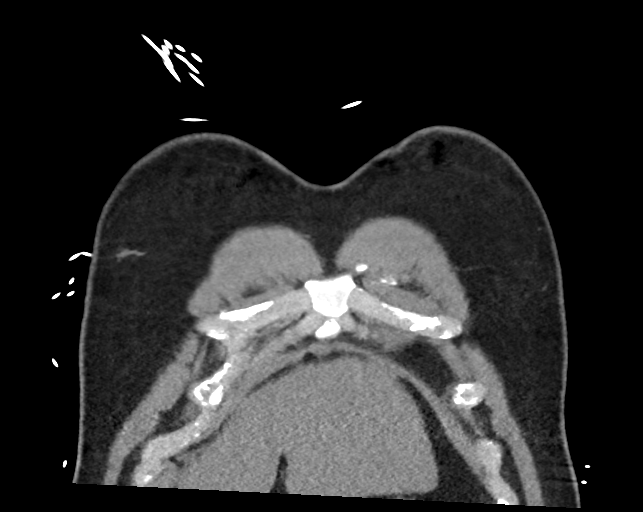
[im 84/167  soft-tissue]
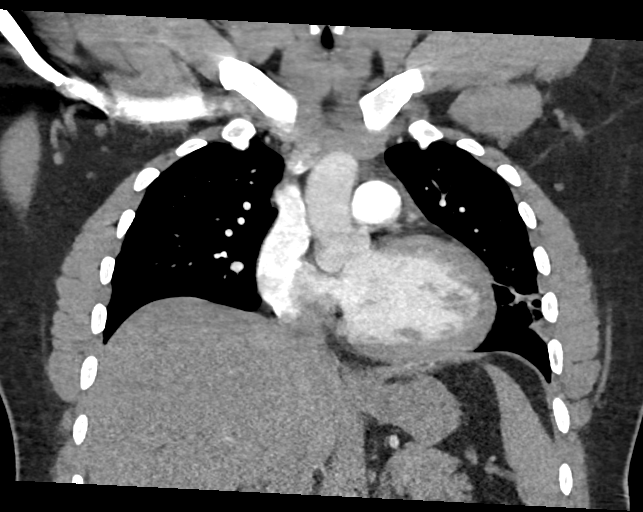
[im 125/167  soft-tissue]
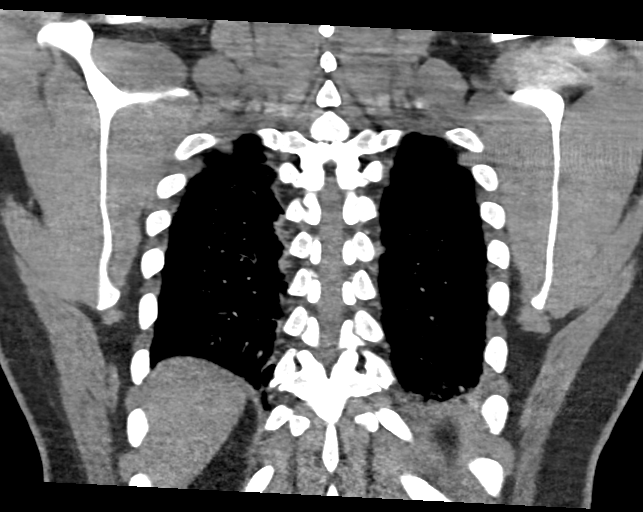

[16 of 46 positions shown; findings below may reference images not displayed]

RADIATION DOSE REDUCTION: This exam was performed according to the
departmental dose-optimization program which includes automated
exposure control, adjustment of the mA and/or kV according to
patient size and/or use of iterative reconstruction technique.

CONTRAST:  100mL OMNIPAQUE IOHEXOL 350 MG/ML SOLN
FINDINGS: Cardiovascular: Pulmonary arteries are normal in caliber and appear
patent. No significant filling defect or acute pulmonary embolus by
CTA.

Intact thoracic aorta. Patent 3 vessel arch anatomy. No aneurysm or
dissection.

No mediastinal hemorrhage or hematoma. Normal heart size. No
pericardial effusion. Central venous structures are patent. No
Bahena process. No anomalous pulmonary venous return.

Mediastinum/Nodes: No enlarged mediastinal, hilar, or axillary lymph
nodes. Thyroid gland, trachea, and esophagus demonstrate no
significant findings.

Lungs/Pleura: Overall low lung volumes with scattered areas of
lingula and bibasilar atelectasis. Medial right lower lobe area of
subpleural atelectasis contains a few subcentimeter calcified
granulomas, images 90 through 97/6.

Upper Abdomen: No acute abnormality.

Musculoskeletal: No chest wall abnormality. No acute or significant
osseous findings.

Review of the MIP images confirms the above findings.
IMPRESSION: Negative for significant acute pulmonary embolus by CTA.

Low lung volumes with lingula and bibasilar atelectasis as above.

Remote granulomatous disease.
